# Patient Record
Sex: Female | Born: 1965 | Race: Black or African American | Hispanic: No | Marital: Married | Smoking: Never smoker
Health system: Southern US, Community
[De-identification: ages and names within clinical notes are randomized; demographics above are authoritative.]

## PROBLEM LIST (undated history)

## (undated) DIAGNOSIS — R1033 Periumbilical pain: Secondary | ICD-10-CM

## (undated) DIAGNOSIS — I2699 Other pulmonary embolism without acute cor pulmonale: Secondary | ICD-10-CM

## (undated) DIAGNOSIS — D649 Anemia, unspecified: Secondary | ICD-10-CM

## (undated) DIAGNOSIS — K5900 Constipation, unspecified: Secondary | ICD-10-CM

## (undated) DIAGNOSIS — D689 Coagulation defect, unspecified: Secondary | ICD-10-CM

## (undated) HISTORY — DX: Periumbilical pain: R10.33

## (undated) HISTORY — DX: Anemia, unspecified: D64.9

## (undated) HISTORY — DX: Coagulation defect, unspecified: D68.9

## (undated) HISTORY — DX: Constipation, unspecified: K59.00

---

## 1997-07-09 ENCOUNTER — Other Ambulatory Visit: Admission: RE | Admit: 1997-07-09 | Discharge: 1997-07-09 | Payer: Self-pay | Admitting: Obstetrics and Gynecology

## 1997-07-11 ENCOUNTER — Other Ambulatory Visit: Admission: RE | Admit: 1997-07-11 | Discharge: 1997-07-11 | Payer: Self-pay | Admitting: Obstetrics and Gynecology

## 1997-11-21 ENCOUNTER — Emergency Department (HOSPITAL_COMMUNITY): Admission: EM | Admit: 1997-11-21 | Discharge: 1997-11-21 | Payer: Self-pay | Admitting: Emergency Medicine

## 1998-02-09 ENCOUNTER — Ambulatory Visit (HOSPITAL_COMMUNITY): Admission: RE | Admit: 1998-02-09 | Discharge: 1998-02-09 | Payer: Self-pay | Admitting: Obstetrics and Gynecology

## 1998-02-09 ENCOUNTER — Encounter: Payer: Self-pay | Admitting: Obstetrics and Gynecology

## 1998-07-23 ENCOUNTER — Ambulatory Visit (HOSPITAL_COMMUNITY): Admission: RE | Admit: 1998-07-23 | Discharge: 1998-07-23 | Payer: Self-pay | Admitting: General Surgery

## 1998-07-23 ENCOUNTER — Encounter (HOSPITAL_BASED_OUTPATIENT_CLINIC_OR_DEPARTMENT_OTHER): Payer: Self-pay | Admitting: General Surgery

## 1998-10-27 ENCOUNTER — Other Ambulatory Visit: Admission: RE | Admit: 1998-10-27 | Discharge: 1998-10-27 | Payer: Self-pay | Admitting: Obstetrics and Gynecology

## 2000-06-19 ENCOUNTER — Other Ambulatory Visit: Admission: RE | Admit: 2000-06-19 | Discharge: 2000-06-19 | Payer: Self-pay | Admitting: Obstetrics and Gynecology

## 2000-06-27 ENCOUNTER — Encounter: Payer: Self-pay | Admitting: Obstetrics and Gynecology

## 2000-06-27 ENCOUNTER — Ambulatory Visit (HOSPITAL_COMMUNITY): Admission: RE | Admit: 2000-06-27 | Discharge: 2000-06-27 | Payer: Self-pay | Admitting: Obstetrics and Gynecology

## 2002-03-13 ENCOUNTER — Other Ambulatory Visit: Admission: RE | Admit: 2002-03-13 | Discharge: 2002-03-13 | Payer: Self-pay | Admitting: Obstetrics and Gynecology

## 2003-09-08 ENCOUNTER — Emergency Department (HOSPITAL_COMMUNITY): Admission: EM | Admit: 2003-09-08 | Discharge: 2003-09-08 | Payer: Self-pay | Admitting: Family Medicine

## 2003-12-18 ENCOUNTER — Emergency Department (HOSPITAL_COMMUNITY): Admission: EM | Admit: 2003-12-18 | Discharge: 2003-12-18 | Payer: Self-pay | Admitting: Emergency Medicine

## 2004-08-09 ENCOUNTER — Emergency Department (HOSPITAL_COMMUNITY): Admission: EM | Admit: 2004-08-09 | Discharge: 2004-08-09 | Payer: Self-pay | Admitting: Internal Medicine

## 2004-11-11 ENCOUNTER — Emergency Department (HOSPITAL_COMMUNITY): Admission: EM | Admit: 2004-11-11 | Discharge: 2004-11-11 | Payer: Self-pay | Admitting: Family Medicine

## 2005-08-22 ENCOUNTER — Ambulatory Visit (HOSPITAL_COMMUNITY): Admission: RE | Admit: 2005-08-22 | Discharge: 2005-08-22 | Payer: Self-pay | Admitting: Internal Medicine

## 2005-08-24 ENCOUNTER — Encounter: Admission: RE | Admit: 2005-08-24 | Discharge: 2005-08-24 | Payer: Self-pay | Admitting: Internal Medicine

## 2006-07-29 ENCOUNTER — Inpatient Hospital Stay (HOSPITAL_COMMUNITY): Admission: EM | Admit: 2006-07-29 | Discharge: 2006-08-03 | Payer: Self-pay | Admitting: Emergency Medicine

## 2006-07-31 ENCOUNTER — Ambulatory Visit: Payer: Self-pay | Admitting: *Deleted

## 2006-07-31 ENCOUNTER — Encounter (INDEPENDENT_AMBULATORY_CARE_PROVIDER_SITE_OTHER): Payer: Self-pay | Admitting: Internal Medicine

## 2006-11-20 ENCOUNTER — Encounter: Admission: RE | Admit: 2006-11-20 | Discharge: 2006-11-20 | Payer: Self-pay | Admitting: Obstetrics and Gynecology

## 2007-03-01 DIAGNOSIS — I2699 Other pulmonary embolism without acute cor pulmonale: Secondary | ICD-10-CM

## 2007-03-01 HISTORY — DX: Other pulmonary embolism without acute cor pulmonale: I26.99

## 2008-02-04 ENCOUNTER — Emergency Department (HOSPITAL_COMMUNITY): Admission: EM | Admit: 2008-02-04 | Discharge: 2008-02-04 | Payer: Self-pay | Admitting: Family Medicine

## 2008-06-30 ENCOUNTER — Emergency Department (HOSPITAL_COMMUNITY): Admission: EM | Admit: 2008-06-30 | Discharge: 2008-06-30 | Payer: Self-pay | Admitting: Family Medicine

## 2008-10-13 ENCOUNTER — Emergency Department (HOSPITAL_COMMUNITY): Admission: EM | Admit: 2008-10-13 | Discharge: 2008-10-13 | Payer: Self-pay | Admitting: Family Medicine

## 2009-01-12 ENCOUNTER — Encounter: Admission: RE | Admit: 2009-01-12 | Discharge: 2009-01-12 | Payer: Self-pay | Admitting: Internal Medicine

## 2009-01-19 ENCOUNTER — Ambulatory Visit (HOSPITAL_COMMUNITY): Admission: RE | Admit: 2009-01-19 | Discharge: 2009-01-19 | Payer: Self-pay | Admitting: Obstetrics and Gynecology

## 2009-01-28 HISTORY — PX: ABDOMINAL HYSTERECTOMY: SHX81

## 2009-01-30 ENCOUNTER — Ambulatory Visit: Payer: Self-pay | Admitting: Oncology

## 2009-02-11 LAB — CMP (CANCER CENTER ONLY)
Alkaline Phosphatase: 68 U/L (ref 26–84)
Glucose, Bld: 105 mg/dL (ref 73–118)
Sodium: 140 mEq/L (ref 128–145)
Total Bilirubin: 0.4 mg/dl (ref 0.20–1.60)
Total Protein: 7 g/dL (ref 6.4–8.1)

## 2009-02-11 LAB — CBC WITH DIFFERENTIAL (CANCER CENTER ONLY)
BASO%: 0.4 % (ref 0.0–2.0)
Eosinophils Absolute: 0.1 10*3/uL (ref 0.0–0.5)
MONO#: 0.3 10*3/uL (ref 0.1–0.9)
MONO%: 6.2 % (ref 0.0–13.0)
NEUT#: 2.6 10*3/uL (ref 1.5–6.5)
Platelets: 206 10*3/uL (ref 145–400)
RBC: 4.89 10*6/uL (ref 3.70–5.32)
WBC: 5 10*3/uL (ref 3.9–10.0)

## 2009-02-16 LAB — RETICULOCYTES (CHCC): Retic Ct Pct: 1.6 % (ref 0.4–3.1)

## 2009-02-16 LAB — IRON AND TIBC
%SAT: 7 % — ABNORMAL LOW (ref 20–55)
Iron: 28 ug/dL — ABNORMAL LOW (ref 42–145)
UIBC: 364 ug/dL

## 2009-02-16 LAB — VITAMIN B12: Vitamin B-12: 387 pg/mL (ref 211–911)

## 2009-02-16 LAB — FOLATE: Folate: 15.1 ng/mL

## 2009-02-16 LAB — HYPERCOAGULABLE PANEL, COMPREHENSIVE RET.
AntiThromb III Func: 101 % (ref 76–126)
Beta-2 Glyco I IgG: <3 U/mL (ref <=15)
Beta-2-Glycoprotein I IgA: <3 U/mL (ref <=15)
Beta-2-Glycoprotein I IgM: <3 U/mL (ref <=15)
Homocysteine: 10.4 umol/L (ref 4.0–15.4)
Protein S Activity: 102 % (ref 69–129)
Protein S Ag, Total: 116 % (ref 70–140)

## 2009-02-16 LAB — HEMOGLOBINOPATHY EVALUATION
Hemoglobin Other: 0 % (ref 0.0–0.0)
Hgb A: 97.9 % — ABNORMAL HIGH (ref 96.8–97.8)

## 2009-02-16 LAB — ERYTHROPOIETIN: Erythropoietin: 33.4 m[IU]/mL (ref 2.6–34.0)

## 2009-02-28 HISTORY — PX: ABDOMINAL SURGERY: SHX537

## 2009-03-03 ENCOUNTER — Ambulatory Visit (HOSPITAL_COMMUNITY): Admission: RE | Admit: 2009-03-03 | Discharge: 2009-03-04 | Payer: Self-pay | Admitting: Obstetrics and Gynecology

## 2009-03-03 ENCOUNTER — Encounter (INDEPENDENT_AMBULATORY_CARE_PROVIDER_SITE_OTHER): Payer: Self-pay | Admitting: Obstetrics and Gynecology

## 2009-03-09 ENCOUNTER — Ambulatory Visit: Payer: Self-pay | Admitting: Oncology

## 2009-03-09 ENCOUNTER — Inpatient Hospital Stay (HOSPITAL_COMMUNITY): Admission: AD | Admit: 2009-03-09 | Discharge: 2009-03-15 | Payer: Self-pay | Admitting: Obstetrics and Gynecology

## 2009-03-10 ENCOUNTER — Ambulatory Visit: Payer: Self-pay | Admitting: Internal Medicine

## 2009-03-10 ENCOUNTER — Ambulatory Visit: Payer: Self-pay | Admitting: Surgery

## 2009-03-12 ENCOUNTER — Encounter (INDEPENDENT_AMBULATORY_CARE_PROVIDER_SITE_OTHER): Payer: Self-pay | Admitting: Obstetrics and Gynecology

## 2009-03-13 ENCOUNTER — Encounter: Payer: Self-pay | Admitting: Internal Medicine

## 2009-03-13 ENCOUNTER — Ambulatory Visit: Payer: Self-pay | Admitting: Oncology

## 2009-03-18 LAB — CBC WITH DIFFERENTIAL (CANCER CENTER ONLY)
BASO#: 0 10*3/uL (ref 0.0–0.2)
MCH: 25.1 pg — ABNORMAL LOW (ref 26.0–34.0)
MCV: 78 fL — ABNORMAL LOW (ref 81–101)
MONO#: 0.4 10*3/uL (ref 0.1–0.9)
MONO%: 5.9 % (ref 0.0–13.0)
NEUT#: 4.1 10*3/uL (ref 1.5–6.5)
RBC: 4.17 10*6/uL (ref 3.70–5.32)
RDW: 18.6 % — ABNORMAL HIGH (ref 10.5–14.6)
WBC: 6 10*3/uL (ref 3.9–10.0)

## 2009-03-23 ENCOUNTER — Ambulatory Visit (HOSPITAL_COMMUNITY): Admission: RE | Admit: 2009-03-23 | Discharge: 2009-03-23 | Payer: Self-pay | Admitting: Oncology

## 2009-03-26 LAB — CBC WITH DIFFERENTIAL (CANCER CENTER ONLY)
BASO#: 0 10*3/uL (ref 0.0–0.2)
BASO%: 0.5 % (ref 0.0–2.0)
EOS%: 2.1 % (ref 0.0–7.0)
Eosinophils Absolute: 0.1 10*3/uL (ref 0.0–0.5)
HCT: 33.4 % — ABNORMAL LOW (ref 34.8–46.6)
HGB: 10.9 g/dL — ABNORMAL LOW (ref 11.6–15.9)
LYMPH#: 1.3 10*3/uL (ref 0.9–3.3)
MCV: 76 fL — ABNORMAL LOW (ref 81–101)
MONO#: 0.3 10*3/uL (ref 0.1–0.9)
NEUT#: 2.8 10*3/uL (ref 1.5–6.5)
Platelets: 375 10*3/uL (ref 145–400)
WBC: 4.6 10*3/uL (ref 3.9–10.0)

## 2009-03-26 LAB — IRON AND TIBC
%SAT: 14 % — ABNORMAL LOW (ref 20–55)
TIBC: 309 ug/dL (ref 250–470)
UIBC: 265 ug/dL

## 2009-05-08 ENCOUNTER — Ambulatory Visit: Payer: Self-pay | Admitting: Oncology

## 2010-01-13 ENCOUNTER — Encounter: Admission: RE | Admit: 2010-01-13 | Discharge: 2010-01-13 | Payer: Self-pay | Admitting: Internal Medicine

## 2010-02-12 ENCOUNTER — Ambulatory Visit: Payer: Self-pay | Admitting: Oncology

## 2010-03-21 ENCOUNTER — Encounter: Payer: Self-pay | Admitting: Internal Medicine

## 2010-05-16 LAB — CROSSMATCH
ABO/RH(D): O POS
Antibody Screen: NEGATIVE

## 2010-05-16 LAB — PROTIME-INR
INR: 1.11 (ref 0.00–1.49)
INR: 1.18 (ref 0.00–1.49)
INR: 1.06 (ref 0.00–1.49)
Prothrombin Time: 14.2 s (ref 11.6–15.2)

## 2010-05-16 LAB — COMPREHENSIVE METABOLIC PANEL
ALT: 67 U/L — ABNORMAL HIGH (ref 0–35)
ALT: 70 U/L — ABNORMAL HIGH (ref 0–35)
AST: 62 U/L — ABNORMAL HIGH (ref 0–37)
Albumin: 2.4 g/dL — ABNORMAL LOW (ref 3.5–5.2)
Albumin: 2.7 g/dL — ABNORMAL LOW (ref 3.5–5.2)
Alkaline Phosphatase: 76 U/L (ref 39–117)
BUN: 11 mg/dL (ref 6–23)
CO2: 24 mEq/L (ref 19–32)
Calcium: 7.7 mg/dL — ABNORMAL LOW (ref 8.4–10.5)
Calcium: 8.4 mg/dL (ref 8.4–10.5)
Chloride: 102 mEq/L (ref 96–112)
Creatinine, Ser: 1.01 mg/dL (ref 0.4–1.2)
GFR calc Af Amer: >60 mL/min (ref >60)
GFR calc Af Amer: >60 mL/min (ref >60)
GFR calc non Af Amer: 60 mL/min — ABNORMAL LOW (ref >60)
GFR calc non Af Amer: >60 mL/min (ref >60)
Glucose, Bld: 146 mg/dL — ABNORMAL HIGH (ref 70–99)
Potassium: 5.4 mEq/L — ABNORMAL HIGH (ref 3.5–5.1)
Potassium: 4.3 mEq/L (ref 3.5–5.1)
Potassium: 6.4 mEq/L (ref 3.5–5.1)
Sodium: 135 mEq/L (ref 135–145)
Sodium: 135 mEq/L (ref 135–145)
Total Protein: 5.2 g/dL — ABNORMAL LOW (ref 6.0–8.3)
Total Protein: 6.1 g/dL (ref 6.0–8.3)

## 2010-05-16 LAB — URINALYSIS, ROUTINE W REFLEX MICROSCOPIC
Bilirubin Urine: NEGATIVE
Nitrite: NEGATIVE
Specific Gravity, Urine: 1.025 (ref 1.005–1.030)
Urobilinogen, UA: 0.2 mg/dL (ref 0.0–1.0)
pH: 6 (ref 5.0–8.0)

## 2010-05-16 LAB — CBC
HCT: 20 % — ABNORMAL LOW (ref 36.0–46.0)
HCT: 21.5 % — ABNORMAL LOW (ref 36.0–46.0)
HCT: 24 % — ABNORMAL LOW (ref 36.0–46.0)
HCT: 24.1 % — ABNORMAL LOW (ref 36.0–46.0)
HCT: 25.9 % — ABNORMAL LOW (ref 36.0–46.0)
HCT: 27.6 % — ABNORMAL LOW (ref 36.0–46.0)
HCT: 29.2 % — ABNORMAL LOW (ref 36.0–46.0)
HCT: 22.9 % — ABNORMAL LOW (ref 36.0–46.0)
HCT: 26 % — ABNORMAL LOW (ref 36.0–46.0)
Hemoglobin: 6.8 g/dL — CL (ref 12.0–15.0)
Hemoglobin: 7.2 g/dL — ABNORMAL LOW (ref 12.0–15.0)
Hemoglobin: 8 g/dL — ABNORMAL LOW (ref 12.0–15.0)
Hemoglobin: 8.2 g/dL — ABNORMAL LOW (ref 12.0–15.0)
Hemoglobin: 8.7 g/dL — ABNORMAL LOW (ref 12.0–15.0)
Hemoglobin: 8.8 g/dL — ABNORMAL LOW (ref 12.0–15.0)
Hemoglobin: 9.4 g/dL — ABNORMAL LOW (ref 12.0–15.0)
Hemoglobin: 9.8 g/dL — ABNORMAL LOW (ref 12.0–15.0)
Hemoglobin: 7.1 g/dL — ABNORMAL LOW (ref 12.0–15.0)
Hemoglobin: 9.2 g/dL — ABNORMAL LOW (ref 12.0–15.0)
Hemoglobin: 8.7 g/dL — ABNORMAL LOW (ref 12.0–15.0)
MCHC: 33.2 g/dL (ref 30.0–36.0)
MCHC: 33.3 g/dL (ref 30.0–36.0)
MCHC: 33.4 g/dL (ref 30.0–36.0)
MCHC: 33.5 g/dL (ref 30.0–36.0)
MCHC: 33.6 g/dL (ref 30.0–36.0)
MCHC: 33.7 g/dL (ref 30.0–36.0)
MCHC: 33.8 g/dL (ref 30.0–36.0)
MCHC: 33.9 g/dL (ref 30.0–36.0)
MCHC: 30.9 g/dL (ref 30.0–36.0)
MCHC: 31.3 g/dL (ref 30.0–36.0)
MCHC: 33 g/dL (ref 30.0–36.0)
MCHC: 33.7 g/dL (ref 30.0–36.0)
MCV: 75.8 fL — ABNORMAL LOW (ref 78.0–100.0)
MCV: 75.8 fL — ABNORMAL LOW (ref 78.0–100.0)
MCV: 76.4 fL — ABNORMAL LOW (ref 78.0–100.0)
MCV: 78.9 fL (ref 78.0–100.0)
MCV: 79.7 fL (ref 78.0–100.0)
MCV: 79.9 fL (ref 78.0–100.0)
MCV: 79.9 fL (ref 78.0–100.0)
MCV: 65.5 fL — ABNORMAL LOW (ref 78.0–100.0)
MCV: 79.7 fL (ref 78.0–100.0)
Platelets: 178 10*3/uL (ref 150–400)
Platelets: 180 10*3/uL (ref 150–400)
Platelets: 191 10*3/uL (ref 150–400)
Platelets: 200 10*3/uL (ref 150–400)
Platelets: 214 10*3/uL (ref 150–400)
Platelets: 218 10*3/uL (ref 150–400)
Platelets: 311 10*3/uL (ref 150–400)
RBC: 2.63 MIL/uL — ABNORMAL LOW (ref 3.87–5.11)
RBC: 2.84 MIL/uL — ABNORMAL LOW (ref 3.87–5.11)
RBC: 3.04 MIL/uL — ABNORMAL LOW (ref 3.87–5.11)
RBC: 3.05 MIL/uL — ABNORMAL LOW (ref 3.87–5.11)
RBC: 3.23 MIL/uL — ABNORMAL LOW (ref 3.87–5.11)
RBC: 3.32 MIL/uL — ABNORMAL LOW (ref 3.87–5.11)
RBC: 3.46 MIL/uL — ABNORMAL LOW (ref 3.87–5.11)
RBC: 3.65 MIL/uL — ABNORMAL LOW (ref 3.87–5.11)
RBC: 3.57 MIL/uL — ABNORMAL LOW (ref 3.87–5.11)
RBC: 3.26 MIL/uL — ABNORMAL LOW (ref 3.87–5.11)
RDW: 20 % — ABNORMAL HIGH (ref 11.5–15.5)
RDW: 20.5 % — ABNORMAL HIGH (ref 11.5–15.5)
RDW: 21.1 % — ABNORMAL HIGH (ref 11.5–15.5)
RDW: 21.2 % — ABNORMAL HIGH (ref 11.5–15.5)
RDW: 21.2 % — ABNORMAL HIGH (ref 11.5–15.5)
RDW: 22.1 % — ABNORMAL HIGH (ref 11.5–15.5)
RDW: 22.1 % — ABNORMAL HIGH (ref 11.5–15.5)
RDW: 22.1 % — ABNORMAL HIGH (ref 11.5–15.5)
RDW: 23.2 % — ABNORMAL HIGH (ref 11.5–15.5)
RDW: 25.9 % — ABNORMAL HIGH (ref 11.5–15.5)
RDW: 26 % — ABNORMAL HIGH (ref 11.5–15.5)
RDW: 26.5 % — ABNORMAL HIGH (ref 11.5–15.5)
WBC: 10.1 10*3/uL (ref 4.0–10.5)
WBC: 11 10*3/uL — ABNORMAL HIGH (ref 4.0–10.5)
WBC: 11.6 10*3/uL — ABNORMAL HIGH (ref 4.0–10.5)
WBC: 12.8 10*3/uL — ABNORMAL HIGH (ref 4.0–10.5)
WBC: 16 10*3/uL — ABNORMAL HIGH (ref 4.0–10.5)
WBC: 18 10*3/uL — ABNORMAL HIGH (ref 4.0–10.5)
WBC: 6.2 10*3/uL (ref 4.0–10.5)

## 2010-05-16 LAB — GLUCOSE, CAPILLARY
Glucose-Capillary: 114 mg/dL — ABNORMAL HIGH (ref 70–99)
Glucose-Capillary: 121 mg/dL — ABNORMAL HIGH (ref 70–99)
Glucose-Capillary: 142 mg/dL — ABNORMAL HIGH (ref 70–99)
Glucose-Capillary: 151 mg/dL — ABNORMAL HIGH (ref 70–99)
Glucose-Capillary: 153 mg/dL — ABNORMAL HIGH (ref 70–99)
Glucose-Capillary: 96 mg/dL (ref 70–99)
Glucose-Capillary: 93 mg/dL (ref 70–99)

## 2010-05-16 LAB — POCT I-STAT 4, (NA,K, GLUC, HGB,HCT)
HCT: 25 % — ABNORMAL LOW (ref 36.0–46.0)
Hemoglobin: 8.5 g/dL — ABNORMAL LOW (ref 12.0–15.0)
Sodium: 139 mEq/L (ref 135–145)

## 2010-05-16 LAB — DIFFERENTIAL
Basophils Absolute: 0 10*3/uL (ref 0.0–0.1)
Basophils Relative: 0 % (ref 0–1)
Eosinophils Absolute: 0 10*3/uL (ref 0.0–0.7)
Eosinophils Absolute: 0 10*3/uL (ref 0.0–0.7)
Eosinophils Relative: 0 % (ref 0–5)
Lymphocytes Relative: 23 % (ref 12–46)
Lymphs Abs: 0.7 10*3/uL (ref 0.7–4.0)
Lymphs Abs: 1 10*3/uL (ref 0.7–4.0)
Monocytes Absolute: 0.7 10*3/uL (ref 0.1–1.0)
Monocytes Absolute: 0.7 10*3/uL (ref 0.1–1.0)
Monocytes Relative: 4 % (ref 3–12)
Monocytes Relative: 7 % (ref 3–12)
Neutro Abs: 14 10*3/uL — ABNORMAL HIGH (ref 1.7–7.7)
Neutro Abs: 4.4 10*3/uL (ref 1.7–7.7)
Neutrophils Relative %: 69 % (ref 43–77)
Neutrophils Relative %: 90 % — ABNORMAL HIGH (ref 43–77)

## 2010-05-16 LAB — CULTURE, BLOOD (ROUTINE X 2): Culture: NO GROWTH

## 2010-05-16 LAB — BASIC METABOLIC PANEL WITH GFR
BUN: 4 mg/dL — ABNORMAL LOW (ref 6–23)
BUN: 9 mg/dL (ref 6–23)
CO2: 24 meq/L (ref 19–32)
CO2: 25 meq/L (ref 19–32)
Calcium: 7.4 mg/dL — ABNORMAL LOW (ref 8.4–10.5)
Calcium: 7.8 mg/dL — ABNORMAL LOW (ref 8.4–10.5)
Chloride: 100 meq/L (ref 96–112)
Chloride: 109 meq/L (ref 96–112)
Creatinine, Ser: 0.66 mg/dL (ref 0.4–1.2)
Creatinine, Ser: 0.76 mg/dL (ref 0.4–1.2)
GFR calc non Af Amer: >60 mL/min
GFR calc non Af Amer: >60 mL/min
Glucose, Bld: 128 mg/dL — ABNORMAL HIGH (ref 70–99)
Glucose, Bld: 98 mg/dL (ref 70–99)
Potassium: 3.6 meq/L (ref 3.5–5.1)
Potassium: 4.1 meq/L (ref 3.5–5.1)
Sodium: 131 meq/L — ABNORMAL LOW (ref 135–145)
Sodium: 139 meq/L (ref 135–145)

## 2010-05-16 LAB — URINE CULTURE

## 2010-05-16 LAB — BASIC METABOLIC PANEL
CO2: 24 mEq/L (ref 19–32)
GFR calc Af Amer: >60 mL/min (ref >60)
Glucose, Bld: 138 mg/dL — ABNORMAL HIGH (ref 70–99)
Potassium: 4.3 mEq/L (ref 3.5–5.1)

## 2010-05-16 LAB — PREGNANCY, URINE: Preg Test, Ur: NEGATIVE

## 2010-05-16 LAB — HEPARIN LEVEL (UNFRACTIONATED): Heparin Unfractionated: 0.41 IU/mL (ref 0.30–0.70)

## 2010-05-16 LAB — FIBRINOGEN: Fibrinogen: 586 mg/dL — ABNORMAL HIGH (ref 204–475)

## 2010-05-16 LAB — HEMOGLOBIN A1C: Hgb A1c MFr Bld: 5.5 % (ref 4.6–6.1)

## 2010-05-16 LAB — APTT
aPTT: 31 seconds (ref 24–37)
aPTT: 34 seconds (ref 24–37)

## 2010-05-16 LAB — TYPE AND SCREEN: ABO/RH(D): O POS

## 2010-05-16 LAB — HEPARIN ANTI-XA: Heparin LMW: 0.94 IU/mL

## 2010-06-02 LAB — CBC
HCT: 27.9 % — ABNORMAL LOW (ref 36.0–46.0)
Hemoglobin: 8.6 g/dL — ABNORMAL LOW (ref 12.0–15.0)
MCHC: 30.7 g/dL (ref 30.0–36.0)
MCV: 61.5 fL — ABNORMAL LOW (ref 78.0–100.0)
RBC: 4.53 MIL/uL (ref 3.87–5.11)
RDW: 24 % — ABNORMAL HIGH (ref 11.5–15.5)

## 2010-07-03 ENCOUNTER — Emergency Department (HOSPITAL_COMMUNITY)
Admission: EM | Admit: 2010-07-03 | Discharge: 2010-07-03 | Disposition: A | Discharge status: Home / Self Care | Payer: Self-pay | Attending: Emergency Medicine | Admitting: Emergency Medicine

## 2010-07-03 ENCOUNTER — Emergency Department (HOSPITAL_COMMUNITY): Payer: Self-pay

## 2010-07-03 DIAGNOSIS — R109 Unspecified abdominal pain: Secondary | ICD-10-CM | POA: Insufficient documentation

## 2010-07-03 DIAGNOSIS — R5381 Other malaise: Secondary | ICD-10-CM | POA: Insufficient documentation

## 2010-07-03 DIAGNOSIS — Z79899 Other long term (current) drug therapy: Secondary | ICD-10-CM | POA: Insufficient documentation

## 2010-07-03 DIAGNOSIS — R197 Diarrhea, unspecified: Secondary | ICD-10-CM | POA: Insufficient documentation

## 2010-07-03 DIAGNOSIS — Z86718 Personal history of other venous thrombosis and embolism: Secondary | ICD-10-CM | POA: Insufficient documentation

## 2010-07-03 DIAGNOSIS — R110 Nausea: Secondary | ICD-10-CM | POA: Insufficient documentation

## 2010-07-03 DIAGNOSIS — R55 Syncope and collapse: Secondary | ICD-10-CM | POA: Insufficient documentation

## 2010-07-03 DIAGNOSIS — R1915 Other abnormal bowel sounds: Secondary | ICD-10-CM | POA: Insufficient documentation

## 2010-07-03 DIAGNOSIS — R0602 Shortness of breath: Secondary | ICD-10-CM | POA: Insufficient documentation

## 2010-07-03 LAB — BASIC METABOLIC PANEL
CO2: 27 mEq/L (ref 19–32)
Calcium: 9.4 mg/dL (ref 8.4–10.5)
Creatinine, Ser: 0.94 mg/dL (ref 0.4–1.2)
GFR calc Af Amer: >60 mL/min (ref >60)
GFR calc non Af Amer: >60 mL/min (ref >60)
Sodium: 139 mEq/L (ref 135–145)

## 2010-07-03 LAB — URINALYSIS, ROUTINE W REFLEX MICROSCOPIC
Glucose, UA: NEGATIVE mg/dL
Leukocytes, UA: NEGATIVE
Protein, ur: NEGATIVE mg/dL
Specific Gravity, Urine: 1.017 (ref 1.005–1.030)
pH: 5.5 (ref 5.0–8.0)

## 2010-07-03 LAB — CBC
MCH: 22.2 pg — ABNORMAL LOW (ref 26.0–34.0)
MCHC: 31.6 g/dL (ref 30.0–36.0)
RDW: 14.9 % (ref 11.5–15.5)

## 2010-07-03 LAB — DIFFERENTIAL
Basophils Absolute: 0 10*3/uL (ref 0.0–0.1)
Basophils Relative: 1 % (ref 0–1)
Eosinophils Absolute: 0.1 10*3/uL (ref 0.0–0.7)
Eosinophils Relative: 1 % (ref 0–5)
Monocytes Absolute: 0.4 10*3/uL (ref 0.1–1.0)
Monocytes Relative: 5 % (ref 3–12)

## 2010-07-03 LAB — URINE MICROSCOPIC-ADD ON

## 2010-07-03 MED ORDER — IOHEXOL 300 MG/ML  SOLN
100.0000 mL | Freq: Once | INTRAMUSCULAR | Status: AC | PRN
  Administered 2010-07-03: 100 mL via INTRAVENOUS

## 2010-07-13 NOTE — H&P (Signed)
NAMETEENA, MANGUS          ACCOUNT NO.:  [id number]   MEDICAL RECORD NO.:  [id number]          PATIENT TYPE:  EMS   LOCATION:  ED                           FACILITY:  [facility]   PHYSICIAN:  Lonia Blood, M.D.       DATE OF BIRTH:  12-15-1965   DATE OF ADMISSION:  07/28/2006  DATE OF DISCHARGE:                              HISTORY & PHYSICAL   CHIEF COMPLAINT:  Chest pain.   HISTORY OF PRESENT ILLNESS:  Ms. Seybold is a 45 year old woman with  a past medical history of anemia who presents [facility] emergency room  with a two week history of worsening shortness of breath and chest  discomfort. She also reports pain in the left side of the neck and head.  The symptoms were severe enough tonight that the patient came to the  emergency room.   PAST MEDICAL HISTORY:  Fibroids.   FAMILY HISTORY:  The patient's mother had breast cancer. She is alive.   SOCIAL HISTORY:  The patient is married. Does smoke cigarettes. Does not  drink alcohol. She has four children. She works as a Automotive engineer.   DRUG ALLERGIES:  No known drug allergies.   MEDICATIONS:  Ibuprofen.   REVIEW OF SYSTEMS:  Positive for headaches. Positive for left neck pain.  Positive for constipation. Positive hemorrhoids. Positive for heavy  menses.   PHYSICAL EXAMINATION:  VITAL SIGNS:  Temperature 98.2, pulse 69,  respirations 20, blood pressure 114/72.  Saturation 98% on room air.  GENERAL:  The patient is alert and oriented to person, place and time.  Well-developed, well-nourished African-American woman in no acute  distress.  HEAD: Normocephalic, atraumatic.  NECK: Without JVD. No carotid bruits. No palpable lymphadenopathy.  CHEST:  Clear to auscultation without wheezes, rales or crackles.  HEART: Regular rate and rhythm with no murmurs, rubs or gallops.  ABDOMEN: The patient's abdomen is soft, nontender and nondistended.  Bowel sounds are present.  EXTREMITIES:  Lower extremities have no edema.  SKIN:  Warm and dry without any suspicious rashes.   LABORATORY DATA:  D. dimer is 1.03.  Sodium 133, potassium 3.4, chloride  98, bicarb 25, BUN 8, creatinine 0.8, glucose 116.  White blood cell  count 6.5, hemoglobin 8.9, platelet count 180.  Chest CT shows right  lower lobe pulmonary embolus.   IMPRESSION:  1. Pulmonary embolus - I am not 100% sure that the CT scan of the      chest is accurate.  The patient does not have tachycardia and does      not have hypoxia.  She may have a minor pulmonary embolism but I      would like to confirm that with a ventilation/perfusion scan. I      would also like to proceed with lower extremity venous Dopplers to      rule out the presence of significant DVT.  The patient will be      started on subcutaneous Lovenox until this is sorted out.  If      indeed Ms. Laffey has a pulmonary embolus we will definitely  need to pursue a hypercoagulable workup since this is an unprovoked      event.  2. Anemia with microcytosis.  Apparently the patient's father has beta      thalassemia. The patient probably has iron deficiency secondary to      her hemorrhoidal bleeding as well as her fibroid reltaed uterine      bleeding.  3. Hypokalemia.  The patient will receive potassium and her BMET will      be monitored.      Lonia Blood, M.D.  Electronically Signed     SL/MEDQ  D:  07/29/2006  T:  07/29/2006  Job:  [id number]   cc:   Merlene Laughter. Renae Gloss, M.D.  Fax: [phone]

## 2010-07-13 NOTE — Discharge Summary (Signed)
Kristy [name], Kristy [name]          ACCOUNT NO.:  [id number]   MEDICAL RECORD NO.:  [id number]          PATIENT TYPE:  INP   LOCATION:  1415                         FACILITY:  [facility]   PHYSICIAN:  Hind I Elsaid, MD      DATE OF BIRTH:  March 01, 1965   DATE OF ADMISSION:  07/28/2006  DATE OF DISCHARGE:                               DISCHARGE SUMMARY   PRIMARY CARE PHYSICIAN:  Merlene Laughter. Renae Gloss, M.D.   DISCHARGE DIAGNOSES:  1. Acute right lower lobe segmental pulmonary embolism.  2. Anemia, macrocytic, secondary to menorrhagia.  3. Asymptomatic bradycardia.   Medications to be dictated on the day of discharge.   PROCEDURE:  CT scan of the chest showed right lower lobe  segmental/subsegmental pulmonary embolism.  No evidence of right heart  strain.  A small hiatal hernia.   Chest x-ray, no acute cardiopulmonary disease.  Mild bronchial  thickening, probably due to chronic bronchitis or smoking.   Echo, left ventricular ejection fraction 60%.  No left ventricular  regional wall motion abnormalities.  Left ventricular diastolic function  was normal.  . There is density on the anterior mitral valve, which may  be calcium but cannot rule out vegetation.   BRIEF HISTORY:  58. A 45 year old female with past medical history of anemia presents      to [facility] with worsening shortness of breath and      chest discomfort.  CT scan showed pulmonary embolism.  D-dimer was      also high, although the patient has not had hypoxia or tachycardia.      We started the patient on Lovenox and Coumadin.  A venous Doppler      of the lower extremity was negative.  A 2D echo was done to rule      out any heart strain with the results as above.  2. Anemia with microcytic anemia.  Patient started on ferrous sulfate      and folic acid.  3. Hypokalemia:  Status post replacement.  4. Bradycardia:  Remains asymptomatic.  Regarding the above 2D echo      report, which was just done today, will  consider transesophageal      echo for evaluation of this density on the anterior mitral valve,      which cannot rule out vegetation.   DISPOSITION:  Will be DC'd home when medically stable.     Hind Bosie Helper, MD  Electronically Signed    HIE/MEDQ  D:  08/01/2006  T:  08/02/2006  Job:  [id number]

## 2010-07-16 NOTE — Discharge Summary (Signed)
[name], Kristy [name]          ACCOUNT NO.:  [id number]   MEDICAL RECORD NO.:  [id number]          PATIENT TYPE:  INP   LOCATION:  1415                         FACILITY:  [facility]   PHYSICIAN:  Lonia Blood, M.D.       DATE OF BIRTH:  [birth year]   DATE OF ADMISSION:  07/28/2006  DATE OF DISCHARGE:  08/03/2006                               DISCHARGE SUMMARY   ADDENDUM   PRIMARY CARE PHYSICIAN:  Dr. Andi Devon.   DISCHARGE DIAGNOSES:  For a list of discharge diagnoses, please refer to  previously dictated discharge summary done by Dr. Ebony Cargo.   DISCHARGE MEDICATIONS:  1. Lovenox 80 mg twice a day until the INR is above 2.  2. Coumadin 10 mg by mouth at 6 p.m.   CONDITION ON DISCHARGE:  [name] was discharged in good  condition.  At the time of discharge, the patient was without any  symptoms.  The patient was instructed to follow up with her primary care  physician on August 04, 2006, at 9:45 a.m. to have a PT/INR drawn.   PROCEDURE:  For complete list of procedures, refer to previously  dictated discharge summary.   CONSULTATIONS:  None.   HOSPITAL COURSE:  [name] was admitted and started on  anticoagulation.  The patient has had a hypercoagulable panel which did  not indicate any genetic predisposition to blood clot formation.  The  patient will be treated with full-dose Coumadin for about 3-6 months at  the discretion of primary care physician and then the anticoagulation  will be stopped and the need for it will be re-evaluated periodically.  [name]'s symptoms improved and her echocardiogram was within  normal limits, we did not feel there was a need to pursue a  transesophageal echocardiogram.  The patient did not display any signs  of bleeding during this hospitalization.      Lonia Blood, M.D.  Electronically Signed     SL/MEDQ  D:  08/06/2006  T:  08/07/2006  Job:  [id number]   cc:   Merlene Laughter. Renae Gloss, M.D.  Fax: [phone]

## 2010-11-25 ENCOUNTER — Ambulatory Visit (INDEPENDENT_AMBULATORY_CARE_PROVIDER_SITE_OTHER): Payer: Commercial Managed Care - PPO | Admitting: Surgery

## 2010-11-25 ENCOUNTER — Encounter (INDEPENDENT_AMBULATORY_CARE_PROVIDER_SITE_OTHER): Payer: Self-pay | Admitting: Surgery

## 2010-11-25 VITALS — BP 102/78 | HR 60 | Temp 96.6°F | Resp 16 | Ht 68.5 in | Wt 182.0 lb

## 2010-11-25 DIAGNOSIS — M6208 Separation of muscle (nontraumatic), other site: Secondary | ICD-10-CM | POA: Insufficient documentation

## 2010-11-25 DIAGNOSIS — M6200 Separation of muscle (nontraumatic), unspecified site: Secondary | ICD-10-CM

## 2010-11-25 DIAGNOSIS — R1084 Generalized abdominal pain: Secondary | ICD-10-CM | POA: Insufficient documentation

## 2010-11-25 NOTE — Patient Instructions (Signed)

## 2010-11-25 NOTE — Progress Notes (Signed)
Subjective:     Patient ID: Kristy [name], female   DOB: February 28, 1966, 45 y.o.   MRN: [id number]  HPI  Patient Care Team: Merlene Laughter. Renae Gloss as PCP - General (Internal Medicine)  This patient is a 45 y.o.female who presents today for surgical evaluation.   She is a healthy female who had an elective hysterectomy last year. She required emergent laparotomy for postoperative bleeding. She recovered from that relatively well. She never noticed any abdominal problems until a few months ago she thought she saw & felt a little bit of bulging above her belly button. It was not definitely noticeable at first. However, she & her husband noticed that there she revealed a bulging on her clothes. No distinct mass. She has some upper domal pulling and discomfort bilaterally. She struggles with chronic constipation.  She noticed that the bulge and comes and goes. It does not seem as severe lately.    It is not seen to be worse with lifting or activity. No fevers chills or sweats. No history of inflammatory bowel disease. She no longer is fully anticoagulated for a prior DVT.  She does note some general upper and mid abdominal pulling and discomfort.  It is bilateral. It does not seem to be worse with eating. She notes discomfort when she strains to defecate. It is not worse with sneezing or cough. It's not particularly related to activity.    Past Medical History  Diagnosis Date  . Anemia   . Clotting disorder   . Acute constipation   . Abdominal pain, periumbilical     Past Surgical History  Procedure Date  . Abdominal surgery january 2011    internal bleeding   . Abdominal hysterectomy December 2010    History   Social History  . Marital Status: Married    Spouse Name: N/A    Number of Children: N/A  . Years of Education: N/A   Occupational History  . Not on file.   Social History Main Topics  . Smoking status: Never Smoker   . Smokeless tobacco: Never Used  . Alcohol Use: No  .  Drug Use: No  . Sexually Active:    Other Topics Concern  . Not on file   Social History Narrative  . No narrative on file    History reviewed. No pertinent family history.  No current outpatient prescriptions on file.  No Known Allergies     Review of Systems  Constitutional: Negative for fever, chills, diaphoresis, appetite change and fatigue.  HENT: Negative for ear pain, sore throat, trouble swallowing, neck pain and ear discharge.   Eyes: Negative for photophobia, discharge and visual disturbance.  Respiratory: Negative for cough, choking, chest tightness and shortness of breath.   Cardiovascular: Negative for chest pain and palpitations.  Gastrointestinal: Positive for constipation. Negative for nausea, vomiting, abdominal pain, diarrhea, blood in stool, anal bleeding and rectal pain.  Genitourinary: Negative for dysuria, frequency and difficulty urinating.  Musculoskeletal: Negative for myalgias and gait problem.  Skin: Negative for color change, pallor and rash.  Neurological: Negative for dizziness, speech difficulty, weakness and numbness.  Hematological: Negative for adenopathy.  Psychiatric/Behavioral: Negative for confusion and agitation. The patient is not nervous/anxious.        Objective:   Physical Exam  Constitutional: She is oriented to person, place, and time. She appears well-developed and well-nourished. No distress.  HENT:  Head: Normocephalic.  Mouth/Throat: Oropharynx is clear and moist. No oropharyngeal exudate.  Eyes: Conjunctivae and EOM  are normal. Pupils are equal, round, and reactive to light. No scleral icterus.  Neck: Normal range of motion. Neck supple. No tracheal deviation present.  Cardiovascular: Normal rate, regular rhythm and intact distal pulses.   Pulmonary/Chest: Effort normal and breath sounds normal. No respiratory distress. She exhibits no tenderness.  Abdominal: Soft. She exhibits no distension and no mass. There is no  tenderness. There is no rebound and no guarding. Hernia confirmed negative in the right inguinal area and confirmed negative in the left inguinal area.       Mild supraumbilical diastasis recti, noticeable on sitting up but resolves mostly upon standing.  Post partum stretched abdomen.  Sensitive at base of umbilicus.  No definite hernias.  Genitourinary: No vaginal discharge found.  Musculoskeletal: Normal range of motion. She exhibits no tenderness.  Lymphadenopathy:    She has no cervical adenopathy.       Right: No inguinal adenopathy present.       Left: No inguinal adenopathy present.  Neurological: She is alert and oriented to person, place, and time. No cranial nerve deficit. She exhibits normal muscle tone. Coordination normal.  Skin: Skin is warm and dry. No rash noted. She is not diaphoretic. No erythema.  Psychiatric: She has a normal mood and affect. Her behavior is normal. Judgment and thought content normal.       Assessment:     Supraumbilical abdominal wall swelling. Probable diastases recti. No definite hernia.  Chronic upper abdominal pain in the setting of chronic constipation. No obstructive symptoms.   Plan:     At this point I laid several options.  I think at the least she needs to be on a more aggressive bowel regimen to have regular bowel movements. I suspect that is at least contributing to her general upper abdominal discomfort.  She may have a subtle hernia. I offered to perform a  diagnostic laparoscopy to see if there's hernia there that may require surgical repair. However I did not push on this toostrongly. She feels reassured that there is no definite hernia. She will monitor and let me know.  She has no history of cancer nor any definite obstructive or other severely concerning symptoms. Therefore, I don't think a CT scan will add much at this point. However, if she gets worse or does not improve it would be reasonable to get that as well.

## 2010-12-03 LAB — POCT URINALYSIS DIP (DEVICE)
Glucose, UA: NEGATIVE mg/dL
Nitrite: NEGATIVE
Protein, ur: 30 mg/dL — AB
Urobilinogen, UA: 1 mg/dL (ref 0.0–1.0)

## 2010-12-03 LAB — DIFFERENTIAL
Eosinophils Relative: 2 % (ref 0–5)
Lymphocytes Relative: 27 % (ref 12–46)
Lymphs Abs: 1.7 10*3/uL (ref 0.7–4.0)
Monocytes Relative: 9 % (ref 3–12)

## 2010-12-03 LAB — POCT I-STAT, CHEM 8
Calcium, Ion: 1.22 mmol/L (ref 1.12–1.32)
Chloride: 104 mEq/L (ref 96–112)
Glucose, Bld: 91 mg/dL (ref 70–99)
HCT: 31 % — ABNORMAL LOW (ref 36.0–46.0)
Hemoglobin: 10.5 g/dL — ABNORMAL LOW (ref 12.0–15.0)
TCO2: 28 mmol/L (ref 0–100)

## 2010-12-03 LAB — CBC
HCT: 29.5 % — ABNORMAL LOW (ref 36.0–46.0)
WBC: 6.2 10*3/uL (ref 4.0–10.5)

## 2010-12-03 LAB — POCT PREGNANCY, URINE: Preg Test, Ur: NEGATIVE

## 2010-12-03 LAB — B-NATRIURETIC PEPTIDE (CONVERTED LAB): Pro B Natriuretic peptide (BNP): <30 pg/mL (ref 0.0–100.0)

## 2010-12-03 LAB — PROTIME-INR
INR: 1 (ref 0.00–1.49)
Prothrombin Time: 13.1 seconds (ref 11.6–15.2)

## 2010-12-03 LAB — APTT: aPTT: 30 seconds (ref 24–37)

## 2010-12-03 LAB — POCT CARDIAC MARKERS

## 2010-12-03 LAB — D-DIMER, QUANTITATIVE: D-Dimer, Quant: 2.63 ug/mL-FEU — ABNORMAL HIGH (ref 0.00–0.48)

## 2010-12-16 LAB — ANTIPHOSPHOLIPID SYNDROME EVAL, BLD
Anticardiolipin IgM: <7 — ABNORMAL LOW (ref <10)
Antiphosphatidylserine IgG: <11 GPS U/mL (ref <11.0)
DRVVT: 35.4 — ABNORMAL LOW (ref 36.1–47.0)
PTT Lupus Anticoagulant: 54 — ABNORMAL HIGH (ref 36.3–48.8)
PTTLA Confirmation: 0 (ref <8.0)

## 2010-12-16 LAB — PROTIME-INR
INR: 1
INR: 1.2
INR: 1.4
INR: 1.9 — ABNORMAL HIGH
Prothrombin Time: 13.6
Prothrombin Time: 15.1
Prothrombin Time: 17.2 — ABNORMAL HIGH
Prothrombin Time: 19.7 — ABNORMAL HIGH
Prothrombin Time: 22.4 — ABNORMAL HIGH

## 2010-12-16 LAB — CBC
Hemoglobin: 9.1 — ABNORMAL LOW
MCHC: 31.9
MCHC: 32.1
MCHC: 32.2
MCV: 62.7 — ABNORMAL LOW
MCV: 62.8 — ABNORMAL LOW
Platelets: 164
RBC: 4.35
RBC: 4.5
RDW: 21.3 — ABNORMAL HIGH
RDW: 21.6 — ABNORMAL HIGH
RDW: 21.7 — ABNORMAL HIGH
WBC: 4.7
WBC: 5.6

## 2010-12-16 LAB — BASIC METABOLIC PANEL
BUN: 10
CO2: 27
Calcium: 8.7
Chloride: 110
Creatinine, Ser: 0.74
Creatinine, Ser: 0.8
GFR calc Af Amer: >60
GFR calc non Af Amer: >60
Sodium: 140

## 2010-12-16 LAB — CARDIAC PANEL(CRET KIN+CKTOT+MB+TROPI)
CK, MB: 0.3
CK, MB: 0.3
CK, MB: 0.4
Relative Index: INVALID
Total CK: 46
Total CK: 47
Total CK: 49
Troponin I: 0.02

## 2010-12-16 LAB — RETICULOCYTES: Retic Count, Absolute: 93.2

## 2010-12-16 LAB — IRON AND TIBC: UIBC: 296

## 2010-12-16 LAB — VITAMIN B12: Vitamin B-12: 409 (ref 211–911)

## 2010-12-16 LAB — SEDIMENTATION RATE: Sed Rate: 20

## 2011-04-07 IMAGING — CT CT ABD-PELV W/ CM
1 of 5 series · 13 of 32 positions shown, 19 images · IV contrast (OMNIPAQUE)
Comparison: Ultrasound 08/24/2005

CLINICAL DATA: Vaginal hysterectomy 1 week ago.  Elevated white
count.  Abdominal pain.

CT ABDOMEN AND PELVIS WITH CONTRAST
TECHNIQUE: Multidetector CT imaging of the abdomen and pelvis was
performed following the standard protocol during bolus
administration of intravenous contrast.
Contrast: 100 ml Omnipaque 300 IV.

[Series 6: routine abdomen/pelvis with · axial · 0.62mm/px · z∈[-361,+29]mm · 13 of 90 slices shown, 19 images]
[im 6/90  soft-tissue]
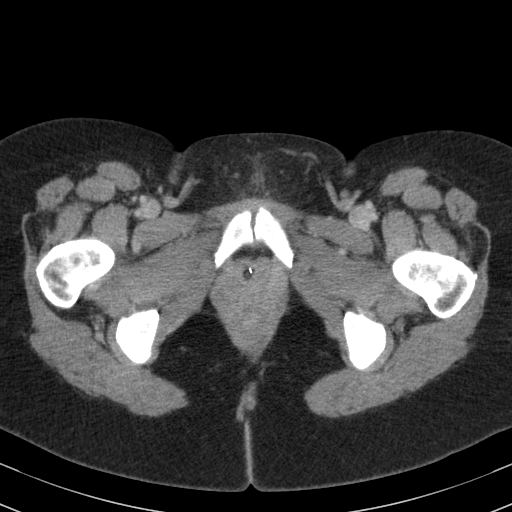
[im 6/90  bone]
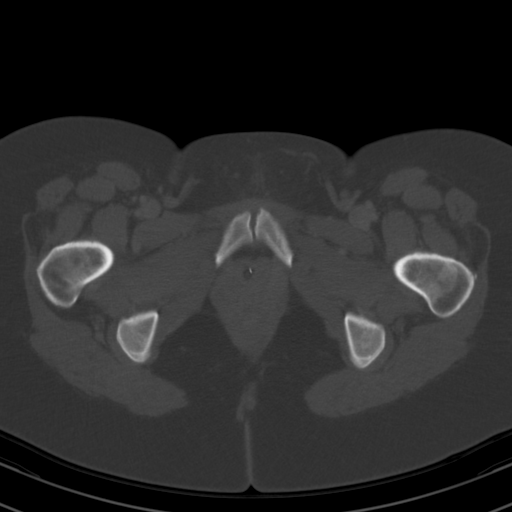
[im 12/90  soft-tissue]
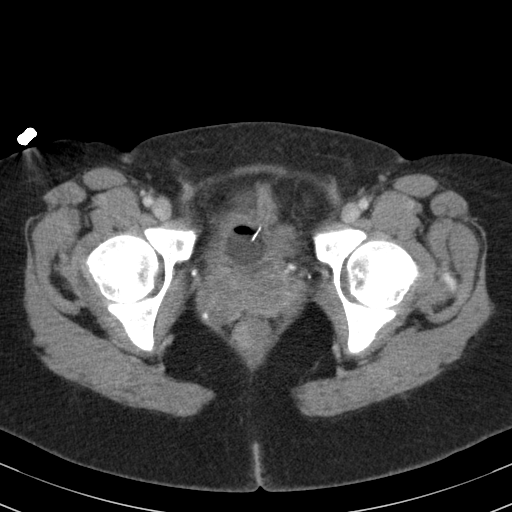
[im 18/90  soft-tissue]
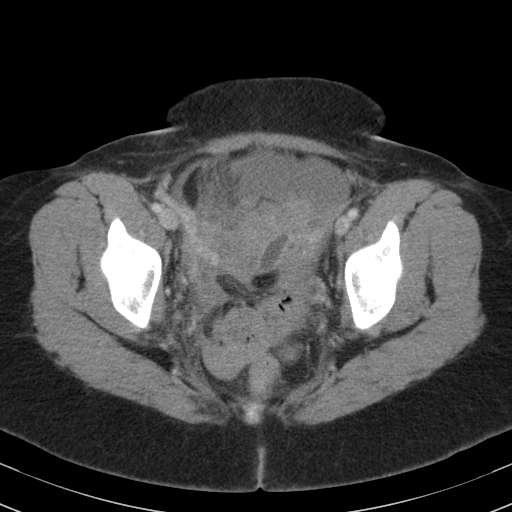
[im 24/90  soft-tissue]
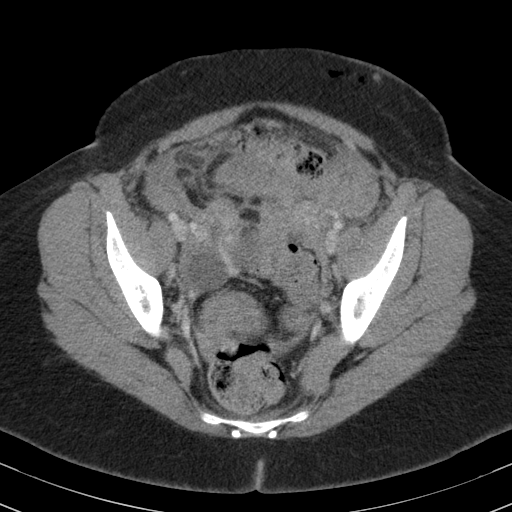
[im 30/90  soft-tissue]
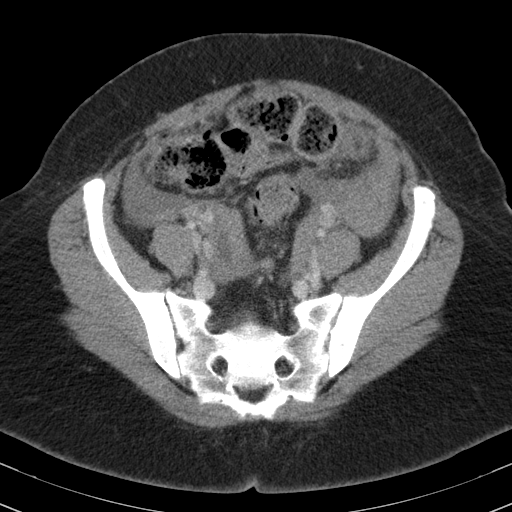
[im 36/90  soft-tissue]
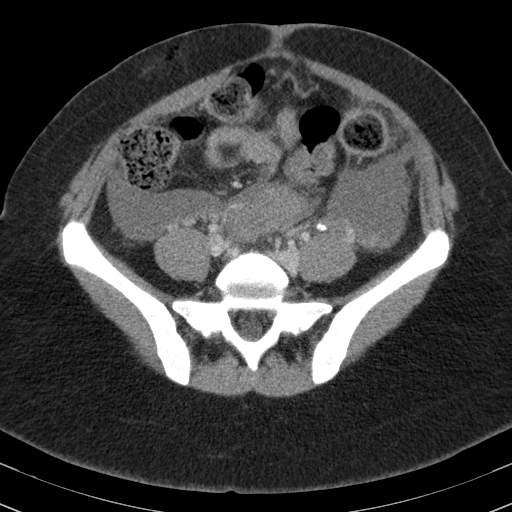
[im 48/90  soft-tissue]
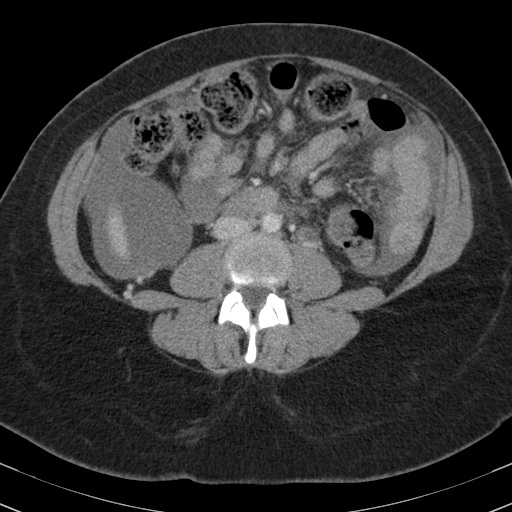
[im 54/90  soft-tissue]
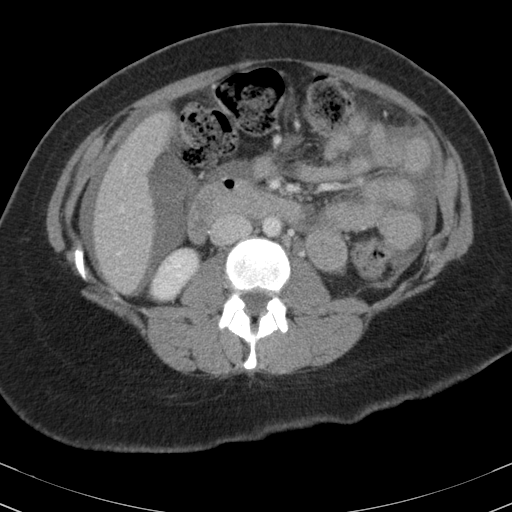
[im 60/90  soft-tissue]
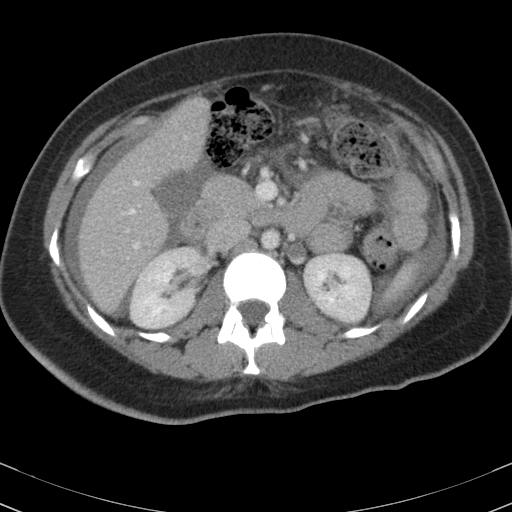
[im 60/90  bone]
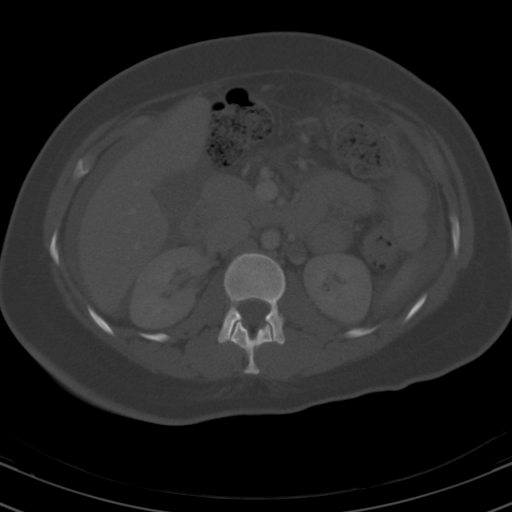
[im 66/90  soft-tissue]
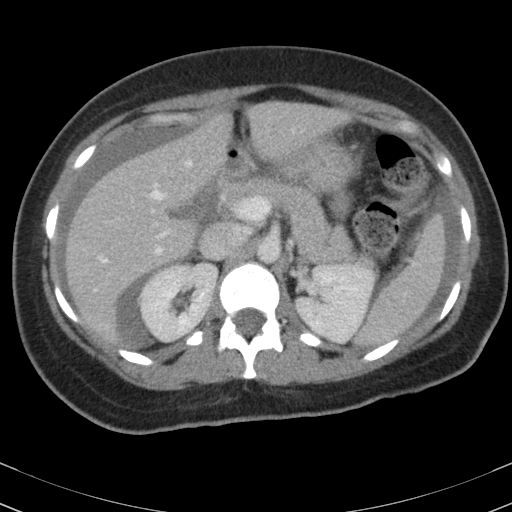
[im 66/90  lung]
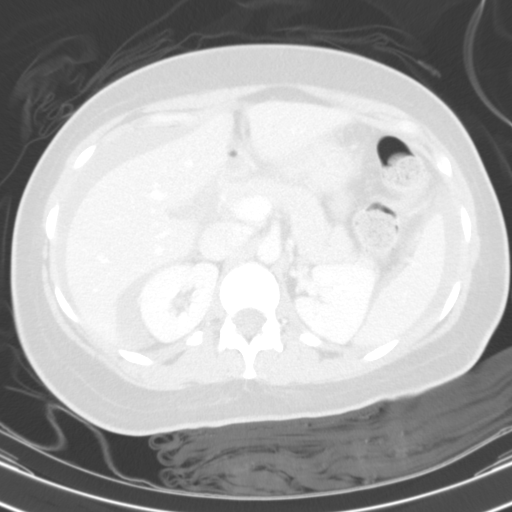
[im 72/90  soft-tissue]
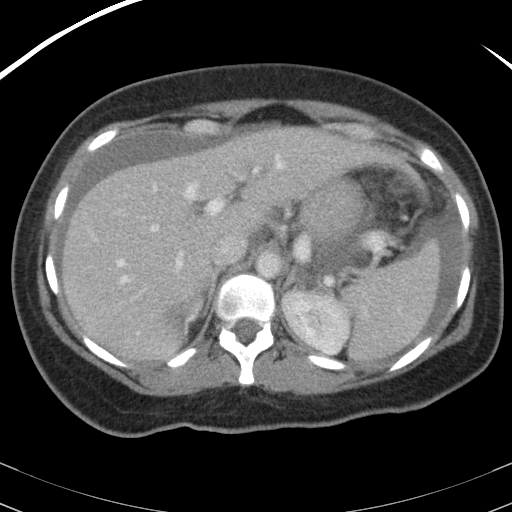
[im 72/90  lung]
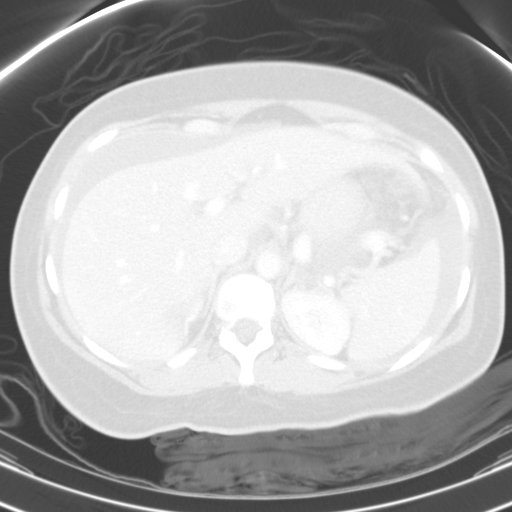
[im 78/90  soft-tissue]
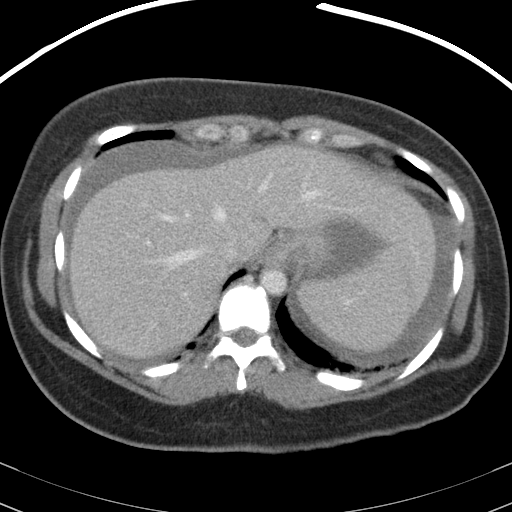
[im 78/90  lung]
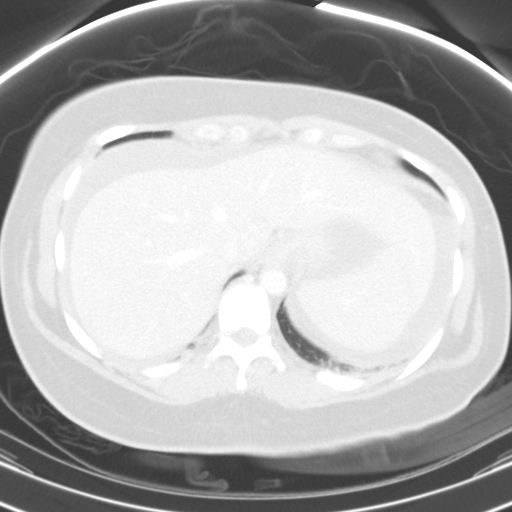
[im 84/90  soft-tissue]
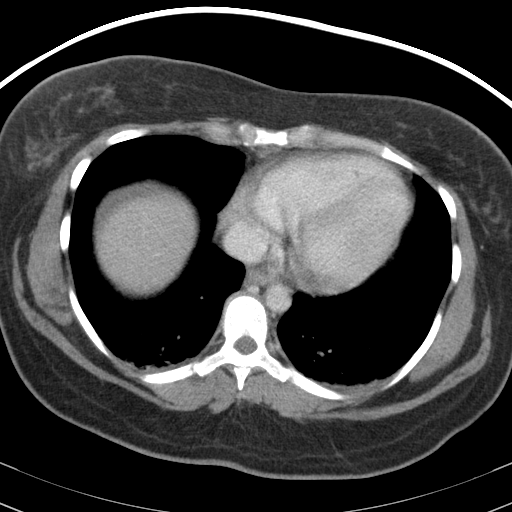
[im 84/90  lung]
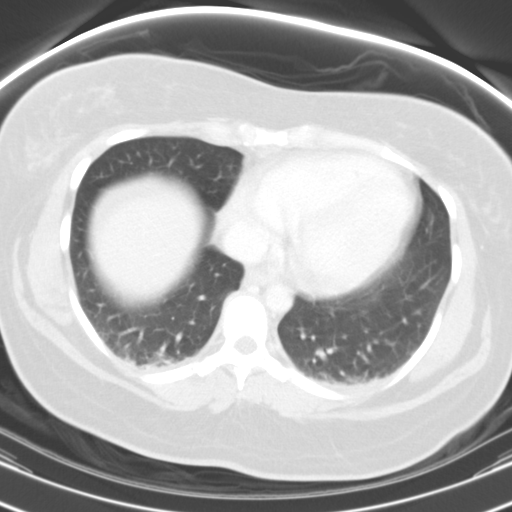

[13 of 32 positions shown; findings below may reference images not displayed]

FINDINGS: There is moderate mixed density ascites in the abdomen.
Fluid in the upper abdomen is low density, then gradually becomes
higher density into the pelvis. Liver, spleen, pancreas, adrenals,
kidneys, gallbladder unremarkable.  Large and small bowel grossly
unremarkable in the upper abdomen.  No free air or adenopathy

The left gonadal vein is distended with central low density
compatible with acute thrombosis.  This continues into the pelvis.

Within the right side of the pelvis, there is a prominent area of
high-density contrast.  This could be extravascular, representing
an area of hemorrhage or pseudoaneurysm.  The the free fluid in the
pelvis is high density, measuring 50 HU.  Centrally in the pelvis,
complex mixed density ill-defined mass is present.  I am concerned
this could represent a large hematoma with blood of different ages.
Large and small bowel grossly unremarkable.
IMPRESSION: I am concerned there can be a possible bleeding vessel or
pseudoaneurysm within the right side of the pelvis.  Mixed density
moderate free fluid throughout the abdomen and pelvis.  Fluid in
the cul-de-sac and pelvis is higher density, compatible with blood.

Left ovarian vein thrombosis.

Critical test results telephoned to Dr. Erxleben at the time of
interpretation on 03/10/2009 at 5855. Dr. Erxleben was not
immediately available.  I left a message with the nurse
practitioner on call to return my call.

## 2011-11-09 ENCOUNTER — Emergency Department (HOSPITAL_COMMUNITY): Payer: BC Managed Care – PPO

## 2011-11-09 ENCOUNTER — Encounter (HOSPITAL_COMMUNITY): Payer: Self-pay | Admitting: Emergency Medicine

## 2011-11-09 ENCOUNTER — Emergency Department (HOSPITAL_COMMUNITY)
Admission: EM | Admit: 2011-11-09 | Discharge: 2011-11-09 | Disposition: A | Discharge status: Home / Self Care | Payer: BC Managed Care – PPO | Attending: Emergency Medicine | Admitting: Emergency Medicine

## 2011-11-09 DIAGNOSIS — R079 Chest pain, unspecified: Secondary | ICD-10-CM | POA: Insufficient documentation

## 2011-11-09 DIAGNOSIS — R0602 Shortness of breath: Secondary | ICD-10-CM | POA: Insufficient documentation

## 2011-11-09 LAB — POCT I-STAT, CHEM 8
Calcium, Ion: 1.18 mmol/L (ref 1.12–1.23)
HCT: 39 % (ref 36.0–46.0)
Hemoglobin: 13.3 g/dL (ref 12.0–15.0)
Sodium: 141 mEq/L (ref 135–145)
TCO2: 23 mmol/L (ref 0–100)

## 2011-11-09 LAB — CBC WITH DIFFERENTIAL/PLATELET
Basophils Relative: 0 % (ref 0–1)
Eosinophils Absolute: 0 10*3/uL (ref 0.0–0.7)
Eosinophils Relative: 1 % (ref 0–5)
Hemoglobin: 11.8 g/dL — ABNORMAL LOW (ref 12.0–15.0)
Lymphs Abs: 2.7 10*3/uL (ref 0.7–4.0)
MCH: 22.3 pg — ABNORMAL LOW (ref 26.0–34.0)
MCHC: 31.7 g/dL (ref 30.0–36.0)
MCV: 70.3 fL — ABNORMAL LOW (ref 78.0–100.0)
Monocytes Absolute: 0.5 10*3/uL (ref 0.1–1.0)
Monocytes Relative: 7 % (ref 3–12)
Neutrophils Relative %: 56 % (ref 43–77)

## 2011-11-09 LAB — URINALYSIS, ROUTINE W REFLEX MICROSCOPIC
Bilirubin Urine: NEGATIVE
Hgb urine dipstick: NEGATIVE
Ketones, ur: NEGATIVE mg/dL
Nitrite: NEGATIVE
pH: 6.5 (ref 5.0–8.0)

## 2011-11-09 LAB — TROPONIN I: Troponin I: <0.3 ng/mL (ref <0.30)

## 2011-11-09 MED ORDER — IOHEXOL 350 MG/ML SOLN
100.0000 mL | Freq: Once | INTRAVENOUS | Status: AC | PRN
Start: 2011-11-09 — End: 2011-11-09
  Administered 2011-11-09: 100 mL via INTRAVENOUS

## 2011-11-09 MED ORDER — SODIUM CHLORIDE 0.9 % IV BOLUS (SEPSIS)
500.0000 mL | Freq: Once | INTRAVENOUS | Status: AC
  Administered 2011-11-09: 500 mL via INTRAVENOUS

## 2011-11-09 NOTE — ED Notes (Signed)
"  funny feeling" in chest and just feel weird, this is how I felt when I had blood clots"  Started after walking around, in heat for a little while,

## 2011-11-09 NOTE — ED Notes (Signed)
Patient states symptoms started around 1 pm today, states similar episode few years ago, was diagnosed with PE's-denies CP, although states SOB, states chest tight-denies pain with deep inspiration-tearful, anxious-will continue to monitor

## 2011-11-09 NOTE — ED Notes (Signed)
Bed:WA17<BR> Expected date:<BR> Expected time:<BR> Means of arrival:<BR> Comments:<BR> Hold for triage 2

## 2011-11-09 NOTE — ED Notes (Signed)
Off floor for testing 

## 2011-11-09 NOTE — ED Provider Notes (Signed)
History     CSN: [id number]  Arrival date & time 11/09/11  1335   First MD Initiated Contact with Patient 11/09/11 1538      Chief Complaint  Patient presents with  . heart fluttering   . Chest Pain    (Consider location/radiation/quality/duration/timing/severity/associated sxs/prior treatment) Patient is a 46 y.o. female presenting with chest pain. The history is provided by the patient.  Chest Pain Primary symptoms include shortness of breath. Pertinent negatives for primary symptoms include no abdominal pain, no nausea and no vomiting.  Pertinent negatives for associated symptoms include no numbness and no weakness.    patient states that she began to feel some chest tightness and fluttering feeling in her chest. It began all she is inside but she had been outside walking around. She states she was not too much exertion. He states it feels like when she had her pulmonary embolisms previously. She states they never found a cause and did a hypercoagulable workup without a clear reason. She is no longer on blood thinners. The clots for 4 years ago. No lightheadedness or dizziness. She states she feels a little short of breath. She states it felt as if something by her heart was flipping around. She states she previously been told she had arrhythmia and was told she had PVCs. No recent travel. She does not smoke. She's not on hormone therapy.  Past Medical History  Diagnosis Date  . Anemia   . Clotting disorder   . Acute constipation   . Abdominal pain, periumbilical     Past Surgical History  Procedure Date  . Abdominal surgery january 2011    internal bleeding   . Abdominal hysterectomy December 2010    History reviewed. No pertinent family history.  History  Substance Use Topics  . Smoking status: Never Smoker   . Smokeless tobacco: Never Used  . Alcohol Use: No    OB History    Grav Para Term Preterm Abortions TAB SAB Ect Mult Living                  Review of  Systems  Constitutional: Negative for activity change and appetite change.  HENT: Negative for neck stiffness.   Eyes: Negative for pain.  Respiratory: Positive for shortness of breath. Negative for chest tightness.   Cardiovascular: Positive for chest pain. Negative for leg swelling.  Gastrointestinal: Negative for nausea, vomiting, abdominal pain and diarrhea.  Genitourinary: Negative for flank pain.  Musculoskeletal: Negative for back pain.  Skin: Negative for rash.  Neurological: Negative for weakness, numbness and headaches.  Psychiatric/Behavioral: Negative for behavioral problems.    Allergies  Review of patient's allergies indicates no known allergies.  Home Medications  No current outpatient prescriptions on file.  BP 101/54  Pulse 63  Temp 98.5 F (36.9 C) (Oral)  Resp 16  SpO2 100%  Physical Exam  Nursing note and vitals reviewed. Constitutional: She is oriented to person, place, and time. She appears well-developed and well-nourished.  HENT:  Head: Normocephalic and atraumatic.  Eyes: EOM are normal. Pupils are equal, round, and reactive to light.  Neck: Normal range of motion. Neck supple.  Cardiovascular: Normal rate, regular rhythm and normal heart sounds.   No murmur heard. Pulmonary/Chest: Effort normal and breath sounds normal. No respiratory distress. She has no wheezes. She has no rales.  Abdominal: Soft. Bowel sounds are normal. She exhibits no distension. There is no tenderness. There is no rebound and no guarding.  Musculoskeletal: Normal range  of motion.  Neurological: She is alert and oriented to person, place, and time. No cranial nerve deficit.  Skin: Skin is warm and dry.  Psychiatric: She has a normal mood and affect. Her speech is normal.    ED Course  Procedures (including critical care time)  Labs Reviewed  URINALYSIS, ROUTINE W REFLEX MICROSCOPIC - Abnormal; Notable for the following:    APPearance CLOUDY (*)     All other components  within normal limits  CBC WITH DIFFERENTIAL - Abnormal; Notable for the following:    RBC 5.29 (*)     Hemoglobin 11.8 (*)     MCV 70.3 (*)     MCH 22.3 (*)     All other components within normal limits  TROPONIN I  POCT I-STAT, CHEM 8   Ct Angio Chest W/cm &/or Wo Cm  11/09/2011  *RADIOLOGY REPORT*  Clinical Data: Chest pain.  CT ANGIOGRAPHY CHEST  Technique:  Multidetector CT imaging of the chest using the standard protocol during bolus administration of intravenous contrast. Multiplanar reconstructed images including MIPs were obtained and reviewed to evaluate the vascular anatomy.  Contrast: OMNIPAQUE IOHEXOL 350 MG/ML SOLN  Comparison: 07/03/2010  Findings: No filling defects in the pulmonary arteries to suggest pulmonary emboli. Heart is normal size. Aorta is normal caliber. No mediastinal, hilar, or axillary adenopathy.  Visualized thyroid and chest wall soft tissues unremarkable. Lungs are clear.  No focal airspace opacities or suspicious nodules.  No effusions. Imaging into the upper abdomen shows no acute findings.  No acute bony abnormality.  IMPRESSION: No evidence of pulmonary embolus.  No acute findings.   Original Report Authenticated By: Cyndie Chime, M.D.      1. Chest pain      Date: 11/09/2011  Rate: 65  Rhythm: normal sinus rhythm  QRS Axis: normal  Intervals: normal  ST/T Wave abnormalities: nonspecific ST/T changes  Conduction Disutrbances:none  Narrative Interpretation:   Old EKG Reviewed: unchanged    MDM  Patient with fluttering and pressure in her chest. States it felt like her previous blood clots. Patient has negative troponin and negative CT angio. She was discharged home.        Juliet Rude. Rubin Payor, MD 11/10/11 1610

## 2014-06-09 ENCOUNTER — Other Ambulatory Visit: Payer: Self-pay | Admitting: Medical

## 2015-09-22 ENCOUNTER — Ambulatory Visit (HOSPITAL_COMMUNITY)
Admission: EM | Admit: 2015-09-22 | Discharge: 2015-09-22 | Disposition: A | Discharge status: Home / Self Care | Payer: BLUE CROSS/BLUE SHIELD | Attending: Family Medicine | Admitting: Family Medicine

## 2015-09-22 ENCOUNTER — Encounter (HOSPITAL_COMMUNITY): Payer: Self-pay | Admitting: Emergency Medicine

## 2015-09-22 DIAGNOSIS — J300 Vasomotor rhinitis: Secondary | ICD-10-CM

## 2015-09-22 HISTORY — DX: Other pulmonary embolism without acute cor pulmonale: I26.99

## 2015-09-22 MED ORDER — IPRATROPIUM BROMIDE 0.06 % NA SOLN: 2.0000 | Freq: Four times a day (QID) | NASAL | 1 refills | Status: DC

## 2015-09-22 MED ORDER — GUAIFENESIN-CODEINE 100-10 MG/5ML PO SYRP: 10.0000 mL | ORAL_SOLUTION | Freq: Four times a day (QID) | ORAL | 0 refills | Status: DC | PRN

## 2015-09-22 NOTE — ED Triage Notes (Signed)
PT reports sore throat, cough, and bilateral ear pain. PT reports cough is productive of green sputum and is especially bad at night. PT has tried OTC treatments for 1 week.

## 2015-09-22 NOTE — ED Provider Notes (Signed)
[place]    CSN: [id number] Arrival date & time: 09/22/15  [id number]  First Provider Contact:  First MD Initiated Contact with Patient 09/22/15 1958        History   Chief Complaint Chief Complaint  Patient presents with  . Cough  . Sore Throat    HPI Kristy [name] is a 50 y.o. female.    URI  Presenting symptoms: congestion, cough, rhinorrhea and sore throat   Presenting symptoms: no fever   Severity:  Mild Duration:  1 week Progression:  Unchanged Chronicity:  New Relieved by:  None tried Worsened by:  Nothing Ineffective treatments:  None tried Associated symptoms: no wheezing     Past Medical History:  Diagnosis Date  . Abdominal pain, periumbilical   . Acute constipation   . Anemia   . Clotting disorder ([place])   . Pulmonary embolism ([place]) 2009    Patient Active Problem List   Diagnosis Date Noted  . Abdominal pain, generalized 11/25/2010  . Diastasis recti 11/25/2010    Past Surgical History:  Procedure Laterality Date  . ABDOMINAL HYSTERECTOMY  December 2010  . ABDOMINAL SURGERY  january 2011   internal bleeding     OB History    No data available       Home Medications    Prior to Admission medications   Not on File    Family History No family history on file.  Social History Social History  Substance Use Topics  . Smoking status: Never Smoker  . Smokeless tobacco: Never Used  . Alcohol use No     Allergies   Review of patient's allergies indicates no known allergies.   Review of Systems Review of Systems  Constitutional: Negative.  Negative for fever.  HENT: Positive for congestion, postnasal drip, rhinorrhea and sore throat. Negative for ear discharge.   Respiratory: Positive for cough. Negative for shortness of breath and wheezing.   Cardiovascular: Negative.   All other systems reviewed and are negative.    Physical Exam Triage Vital Signs ED Triage Vitals  Enc Vitals Group     BP 09/22/15  1935 119/72     Pulse Rate 09/22/15 1935 65     Resp --      Temp 09/22/15 1935 98.5 F (36.9 C)     Temp Source 09/22/15 1935 Oral     SpO2 09/22/15 1935 98 %     Weight 09/22/15 1935 190 lb (86.2 kg)     Height 09/22/15 1935 5' 8.5" (1.74 m)     Head Circumference --      Peak Flow --      Pain Score 09/22/15 1946 6     Pain Loc --      Pain Edu? --      Excl. in [place]? --    No data found.   Updated Vital Signs BP 119/72 (BP Location: Left Arm)   Pulse 65   Temp 98.5 F (36.9 C) (Oral)   Ht 5' 8.5" (1.74 m)   Wt 190 lb (86.2 kg)   SpO2 98%   BMI 28.47 kg/m   Visual Acuity Right Eye Distance:   Left Eye Distance:   Bilateral Distance:    Right Eye Near:   Left Eye Near:    Bilateral Near:     Physical Exam  Constitutional: She appears well-developed and well-nourished.  HENT:  Mouth/Throat: Oropharynx is clear and moist.  bilat cerumen impaction   Neck: Normal range of  motion. Neck supple.  Cardiovascular: Normal rate and normal heart sounds.   Pulmonary/Chest: Effort normal and breath sounds normal.  Lymphadenopathy:    She has no cervical adenopathy.  Skin: Skin is warm.  Nursing note and vitals reviewed.    UC Treatments / Results  Labs (all labs ordered are listed, but only abnormal results are displayed) Labs Reviewed - No data to display  EKG  EKG Interpretation None       Radiology No results found.  Procedures Procedures (including critical care time)  Medications Ordered in UC Medications - No data to display   Initial Impression / Assessment and Plan / UC Course  I have reviewed the triage vital signs and the nursing notes.  Pertinent labs & imaging results that were available during my care of the patient were reviewed by me and considered in my medical decision making (see chart for details).  Clinical Course      Final Clinical Impressions(s) / UC Diagnoses   Final diagnoses:  None    New Prescriptions New  Prescriptions   No medications on file     Billy Fischer, MD 09/22/15 2015

## 2016-09-13 ENCOUNTER — Other Ambulatory Visit: Payer: Self-pay | Admitting: Nurse Practitioner

## 2016-09-13 DIAGNOSIS — R1084 Generalized abdominal pain: Secondary | ICD-10-CM

## 2016-09-20 ENCOUNTER — Ambulatory Visit
Admission: RE | Admit: 2016-09-20 | Discharge: 2016-09-20 | Disposition: A | Discharge status: Home / Self Care | Payer: BLUE CROSS/BLUE SHIELD | Source: Ambulatory Visit | Attending: Nurse Practitioner | Admitting: Nurse Practitioner

## 2016-09-20 DIAGNOSIS — R1084 Generalized abdominal pain: Secondary | ICD-10-CM

## 2017-03-17 DIAGNOSIS — R7309 Other abnormal glucose: Secondary | ICD-10-CM | POA: Diagnosis not present

## 2017-03-17 DIAGNOSIS — E785 Hyperlipidemia, unspecified: Secondary | ICD-10-CM | POA: Diagnosis not present

## 2017-06-06 DIAGNOSIS — R0609 Other forms of dyspnea: Secondary | ICD-10-CM | POA: Diagnosis not present

## 2017-06-06 DIAGNOSIS — R079 Chest pain, unspecified: Secondary | ICD-10-CM | POA: Diagnosis not present

## 2017-06-06 DIAGNOSIS — R0789 Other chest pain: Secondary | ICD-10-CM | POA: Diagnosis not present

## 2017-06-06 DIAGNOSIS — D509 Iron deficiency anemia, unspecified: Secondary | ICD-10-CM | POA: Diagnosis not present

## 2017-06-06 DIAGNOSIS — R42 Dizziness and giddiness: Secondary | ICD-10-CM | POA: Diagnosis not present

## 2017-06-06 DIAGNOSIS — R0602 Shortness of breath: Secondary | ICD-10-CM | POA: Diagnosis not present

## 2017-06-07 DIAGNOSIS — R079 Chest pain, unspecified: Secondary | ICD-10-CM | POA: Diagnosis not present

## 2017-06-07 DIAGNOSIS — R001 Bradycardia, unspecified: Secondary | ICD-10-CM | POA: Diagnosis not present

## 2017-11-17 DIAGNOSIS — Z1231 Encounter for screening mammogram for malignant neoplasm of breast: Secondary | ICD-10-CM | POA: Diagnosis not present

## 2017-11-17 DIAGNOSIS — Z803 Family history of malignant neoplasm of breast: Secondary | ICD-10-CM | POA: Diagnosis not present

## 2018-02-08 ENCOUNTER — Encounter (HOSPITAL_COMMUNITY): Payer: Self-pay

## 2018-02-08 ENCOUNTER — Ambulatory Visit (HOSPITAL_COMMUNITY)
Admission: EM | Admit: 2018-02-08 | Discharge: 2018-02-08 | Disposition: A | Discharge status: Home / Self Care | Payer: 59 | Attending: Family Medicine | Admitting: Family Medicine

## 2018-02-08 DIAGNOSIS — R300 Dysuria: Secondary | ICD-10-CM | POA: Diagnosis not present

## 2018-02-08 DIAGNOSIS — R350 Frequency of micturition: Secondary | ICD-10-CM | POA: Insufficient documentation

## 2018-02-08 LAB — POCT URINALYSIS DIP (DEVICE)
BILIRUBIN URINE: NEGATIVE
Glucose, UA: NEGATIVE mg/dL
HGB URINE DIPSTICK: NEGATIVE
KETONES UR: NEGATIVE mg/dL
LEUKOCYTES UA: NEGATIVE
Nitrite: NEGATIVE
PROTEIN: NEGATIVE mg/dL
SPECIFIC GRAVITY, URINE: 1.025 (ref 1.005–1.030)
Urobilinogen, UA: 0.2 mg/dL (ref 0.0–1.0)
pH: 5.5 (ref 5.0–8.0)

## 2018-02-08 NOTE — Discharge Instructions (Signed)
Urine did not show signs of infection Urine culture sent.  We will call you with the results.   Push fluids and get plenty of rest.   Follow up with PCP if symptoms persists Return here or go to ER if you have any new or worsening symptoms such as fever, worsening abdominal pain, nausea/vomiting, flank pain, etc..Marland Kitchen

## 2018-02-08 NOTE — ED Provider Notes (Signed)
[facility]   CC: burning and frequency with urination  SUBJECTIVE:  Kristy [name] is a 52 y.o. female who complains of improving urinary frequency and dysuria for the past x 8 days.  Does admit to recent change in bath products.  Denies abdominal or flank pain.  Has tried OTC medications including AZO and pushing fluids with relief.  Symptoms are made worse with urination.  Admits to similar symptoms in the past.  Denies fever, chills, nausea, vomiting, abdominal pain, flank pain, abnormal vaginal discharge or bleeding, hematuria.    LMP: No LMP recorded. Patient has had a hysterectomy.  ROS: As in HPI.  Past Medical History:  Diagnosis Date  . Abdominal pain, periumbilical   . Acute constipation   . Anemia   . Clotting disorder ([place])   . Pulmonary embolism ([place]) 2009   Past Surgical History:  Procedure Laterality Date  . ABDOMINAL HYSTERECTOMY  December 2010  . ABDOMINAL SURGERY  january 2011   internal bleeding    No Known Allergies No current facility-administered medications on file prior to encounter.    No current outpatient medications on file prior to encounter.   Social History   Socioeconomic History  . Marital status: Married    Spouse name: Not on file  . Number of children: Not on file  . Years of education: Not on file  . Highest education level: Not on file  Occupational History  . Not on file  Social Needs  . Financial resource strain: Not on file  . Food insecurity:    Worry: Not on file    Inability: Not on file  . Transportation needs:    Medical: Not on file    Non-medical: Not on file  Tobacco Use  . Smoking status: Never Smoker  . Smokeless tobacco: Never Used  Substance and Sexual Activity  . Alcohol use: No  . Drug use: No  . Sexual activity: Not on file  Lifestyle  . Physical activity:    Days per week: Not on file    Minutes per session: Not on file  . Stress: Not on file  Relationships  . Social connections:     Talks on phone: Not on file    Gets together: Not on file    Attends religious service: Not on file    Active member of club or organization: Not on file    Attends meetings of clubs or organizations: Not on file    Relationship status: Not on file  . Intimate partner violence:    Fear of current or ex partner: Not on file    Emotionally abused: Not on file    Physically abused: Not on file    Forced sexual activity: Not on file  Other Topics Concern  . Not on file  Social History Narrative  . Not on file   History reviewed. No pertinent family history.  OBJECTIVE:  Vitals:   02/08/18 1032  BP: (!) 107/51  Pulse: 65  Resp: 16  Temp: 97.9 F (36.6 C)  TempSrc: Oral  SpO2: 99%   General appearance: AOx3 in no acute distress HEENT: NCAT.  Oropharynx clear.  Lungs: clear to auscultation bilaterally without adventitious breath sounds Heart: regular rate and rhythm.  Radial pulses 2+ symmetrical bilaterally Abdomen: soft; non-distended; no tenderness; bowel sounds present; no guarding Back: no CVA tenderness Extremities: no edema; symmetrical with no gross deformities Skin: warm and dry Neurologic: Ambulates from chair to exam table without difficulty Psychological:  alert and cooperative; normal mood and affect  Labs Reviewed  POCT URINALYSIS DIP (DEVICE)   Results for orders placed or performed during the hospital encounter of 02/08/18 (from the past 24 hour(s))  POCT urinalysis dip (device)     Status: None   Collection Time: 02/08/18 10:34 AM  Result Value Ref Range   Glucose, UA NEGATIVE NEGATIVE mg/dL   Bilirubin Urine NEGATIVE NEGATIVE   Ketones, ur NEGATIVE NEGATIVE mg/dL   Specific Gravity, Urine 1.025 1.005 - 1.030   Hgb urine dipstick NEGATIVE NEGATIVE   pH 5.5 5.0 - 8.0   Protein, ur NEGATIVE NEGATIVE mg/dL   Urobilinogen, UA 0.2 0.0 - 1.0 mg/dL   Nitrite NEGATIVE NEGATIVE   Leukocytes, UA NEGATIVE NEGATIVE    ASSESSMENT & PLAN:  1. Dysuria   2.  Urinary frequency    Urine did not show signs of infection Urine culture sent.  We will call you with the results.   Push fluids and get plenty of rest.   Follow up with PCP if symptoms persists Return here or go to ER if you have any new or worsening symptoms such as fever, worsening abdominal pain, nausea/vomiting, flank pain, etc...  Outlined signs and symptoms indicating need for more acute intervention. Patient verbalized understanding. After Visit Summary given.     Lestine Box, PA-C 02/08/18 1152

## 2018-02-08 NOTE — ED Triage Notes (Signed)
Pt presents burning while urinating and frequency to the bathroom.  Pt symptoms started last Wednesday.

## 2018-03-07 ENCOUNTER — Ambulatory Visit (INDEPENDENT_AMBULATORY_CARE_PROVIDER_SITE_OTHER): Payer: 59 | Admitting: Nurse Practitioner

## 2018-03-07 ENCOUNTER — Encounter: Payer: Self-pay | Admitting: Nurse Practitioner

## 2018-03-07 VITALS — BP 110/70 | HR 77 | Temp 98.0°F | Ht 67.6 in | Wt 199.8 lb

## 2018-03-07 DIAGNOSIS — R7303 Prediabetes: Secondary | ICD-10-CM | POA: Insufficient documentation

## 2018-03-07 DIAGNOSIS — Z0000 Encounter for general adult medical examination without abnormal findings: Secondary | ICD-10-CM

## 2018-03-07 DIAGNOSIS — E669 Obesity, unspecified: Secondary | ICD-10-CM | POA: Insufficient documentation

## 2018-03-07 DIAGNOSIS — E559 Vitamin D deficiency, unspecified: Secondary | ICD-10-CM

## 2018-03-07 DIAGNOSIS — Z139 Encounter for screening, unspecified: Secondary | ICD-10-CM | POA: Diagnosis not present

## 2018-03-07 DIAGNOSIS — Z23 Encounter for immunization: Secondary | ICD-10-CM | POA: Diagnosis not present

## 2018-03-07 LAB — POCT URINALYSIS DIPSTICK
BILIRUBIN UA: NEGATIVE
Blood, UA: NEGATIVE
GLUCOSE UA: NEGATIVE
Ketones, UA: NEGATIVE
Leukocytes, UA: NEGATIVE
Nitrite, UA: NEGATIVE
PH UA: 5.5 (ref 5.0–8.0)
Protein, UA: NEGATIVE
Spec Grav, UA: 1.02 (ref 1.010–1.025)
Urobilinogen, UA: 0.2 E.U./dL

## 2018-03-07 MED ORDER — TETANUS-DIPHTH-ACELL PERTUSSIS 5-2.5-18.5 LF-MCG/0.5 IM SUSP
0.5000 mL | Freq: Once | INTRAMUSCULAR | Status: AC
  Administered 2018-03-07: 0.5 mL via INTRAMUSCULAR

## 2018-03-07 NOTE — Patient Instructions (Signed)
Health Maintenance, Female Adopting a healthy lifestyle and getting preventive care can go a long way to promote health and wellness. Talk with your health care provider about what schedule of regular examinations is right for you. This is a good chance for you to check in with your provider about disease prevention and staying healthy. In between checkups, there are plenty of things you can do on your own. Experts have done a lot of research about which lifestyle changes and preventive measures are most likely to keep you healthy. Ask your health care provider for more information. Weight and diet Eat a healthy diet  Be sure to include plenty of vegetables, fruits, low-fat dairy products, and lean protein.  Do not eat a lot of foods high in solid fats, added sugars, or salt.  Get regular exercise. This is one of the most important things you can do for your health. ? Most adults should exercise for at least 150 minutes each week. The exercise should increase your heart rate and make you sweat (moderate-intensity exercise). ? Most adults should also do strengthening exercises at least twice a week. This is in addition to the moderate-intensity exercise. Maintain a healthy weight  Body mass index (BMI) is a measurement that can be used to identify possible weight problems. It estimates body fat based on height and weight. Your health care provider can help determine your BMI and help you achieve or maintain a healthy weight.  For females 5 years of age and older: ? A BMI below 18.5 is considered underweight. ? A BMI of 18.5 to 24.9 is normal. ? A BMI of 25 to 29.9 is considered overweight. ? A BMI of 30 and above is considered obese. Watch levels of cholesterol and blood lipids  You should start having your blood tested for lipids and cholesterol at 54 years of age, then have this test every 5 years.  You may need to have your cholesterol levels checked more often if: ? Your lipid or  cholesterol levels are high. ? You are older than 54 years of age. ? You are at high risk for heart disease. Cancer screening Lung Cancer  Lung cancer screening is recommended for adults 48-79 years old who are at high risk for lung cancer because of a history of smoking.  A yearly low-dose CT scan of the lungs is recommended for people who: ? Currently smoke. ? Have quit within the past 15 years. ? Have at least a 30-pack-year history of smoking. A pack year is smoking an average of one pack of cigarettes a day for 1 year.  Yearly screening should continue until it has been 15 years since you quit.  Yearly screening should stop if you develop a health problem that would prevent you from having lung cancer treatment. Breast Cancer  Practice breast self-awareness. This means understanding how your breasts normally appear and feel.  It also means doing regular breast self-exams. Let your health care provider know about any changes, no matter how small.  If you are in your 20s or 30s, you should have a clinical breast exam (CBE) by a health care provider every 1-3 years as part of a regular health exam.  If you are 22 or older, have a CBE every year. Also consider having a breast X-ray (mammogram) every year.  If you have a family history of breast cancer, talk to your health care provider about genetic screening.  If you are at high risk for breast cancer, talk  to your health care provider about having an MRI and a mammogram every year.  Breast cancer gene (BRCA) assessment is recommended for women who have family members with BRCA-related cancers. BRCA-related cancers include: ? Breast. ? Ovarian. ? Tubal. ? Peritoneal cancers.  Results of the assessment will determine the need for genetic counseling and BRCA1 and BRCA2 testing. Cervical Cancer Your health care provider may recommend that you be screened regularly for cancer of the pelvic organs (ovaries, uterus, and vagina).  This screening involves a pelvic examination, including checking for microscopic changes to the surface of your cervix (Pap test). You may be encouraged to have this screening done every 3 years, beginning at age 21.  For women ages 30-65, health care providers may recommend pelvic exams and Pap testing every 3 years, or they may recommend the Pap and pelvic exam, combined with testing for human papilloma virus (HPV), every 5 years. Some types of HPV increase your risk of cervical cancer. Testing for HPV may also be done on women of any age with unclear Pap test results.  Other health care providers may not recommend any screening for nonpregnant women who are considered low risk for pelvic cancer and who do not have symptoms. Ask your health care provider if a screening pelvic exam is right for you.  If you have had past treatment for cervical cancer or a condition that could lead to cancer, you need Pap tests and screening for cancer for at least 20 years after your treatment. If Pap tests have been discontinued, your risk factors (such as having a new sexual partner) need to be reassessed to determine if screening should resume. Some women have medical problems that increase the chance of getting cervical cancer. In these cases, your health care provider may recommend more frequent screening and Pap tests. Colorectal Cancer  This type of cancer can be detected and often prevented.  Routine colorectal cancer screening usually begins at 54 years of age and continues through 54 years of age.  Your health care provider may recommend screening at an earlier age if you have risk factors for colon cancer.  Your health care provider may also recommend using home test kits to check for hidden blood in the stool.  A small camera at the end of a tube can be used to examine your colon directly (sigmoidoscopy or colonoscopy). This is done to check for the earliest forms of colorectal cancer.  Routine  screening usually begins at age 50.  Direct examination of the colon should be repeated every 5-10 years through 54 years of age. However, you may need to be screened more often if early forms of precancerous polyps or small growths are found. Skin Cancer  Check your skin from head to toe regularly.  Tell your health care provider about any new moles or changes in moles, especially if there is a change in a mole's shape or color.  Also tell your health care provider if you have a mole that is larger than the size of a pencil eraser.  Always use sunscreen. Apply sunscreen liberally and repeatedly throughout the day.  Protect yourself by wearing long sleeves, pants, a wide-brimmed hat, and sunglasses whenever you are outside. Heart disease, diabetes, and high blood pressure  High blood pressure causes heart disease and increases the risk of stroke. High blood pressure is more likely to develop in: ? People who have blood pressure in the high end of the normal range (130-139/85-89 mm Hg). ? People   who are overweight or obese. ? People who are African American.  If you are 84-22 years of age, have your blood pressure checked every 3-5 years. If you are 67 years of age or older, have your blood pressure checked every year. You should have your blood pressure measured twice-once when you are at a hospital or clinic, and once when you are not at a hospital or clinic. Record the average of the two measurements. To check your blood pressure when you are not at a hospital or clinic, you can use: ? An automated blood pressure machine at a pharmacy. ? A home blood pressure monitor.  If you are between 52 years and 3 years old, ask your health care provider if you should take aspirin to prevent strokes.  Have regular diabetes screenings. This involves taking a blood sample to check your fasting blood sugar level. ? If you are at a normal weight and have a low risk for diabetes, have this test once  every three years after 54 years of age. ? If you are overweight and have a high risk for diabetes, consider being tested at a younger age or more often. Preventing infection Hepatitis B  If you have a higher risk for hepatitis B, you should be screened for this virus. You are considered at high risk for hepatitis B if: ? You were born in a country where hepatitis B is common. Ask your health care provider which countries are considered high risk. ? Your parents were born in a high-risk country, and you have not been immunized against hepatitis B (hepatitis B vaccine). ? You have HIV or AIDS. ? You use needles to inject street drugs. ? You live with someone who has hepatitis B. ? You have had sex with someone who has hepatitis B. ? You get hemodialysis treatment. ? You take certain medicines for conditions, including cancer, organ transplantation, and autoimmune conditions. Hepatitis C  Blood testing is recommended for: ? Everyone born from [birth year] through 1966. ? Anyone with known risk factors for hepatitis C. Sexually transmitted infections (STIs)  You should be screened for sexually transmitted infections (STIs) including gonorrhea and chlamydia if: ? You are sexually active and are younger than 54 years of age. ? You are older than 55 years of age and your health care provider tells you that you are at risk for this type of infection. ? Your sexual activity has changed since you were last screened and you are at an increased risk for chlamydia or gonorrhea. Ask your health care provider if you are at risk.  If you do not have HIV, but are at risk, it may be recommended that you take a prescription medicine daily to prevent HIV infection. This is called pre-exposure prophylaxis (PrEP). You are considered at risk if: ? You are sexually active and do not regularly use condoms or know the HIV status of your partner(s). ? You take drugs by injection. ? You are sexually active with a partner  who has HIV. Talk with your health care provider about whether you are at high risk of being infected with HIV. If you choose to begin PrEP, you should first be tested for HIV. You should then be tested every 3 months for as long as you are taking PrEP. Pregnancy  If you are premenopausal and you may become pregnant, ask your health care provider about preconception counseling.  If you may become pregnant, take 400 to 800 micrograms (mcg) of folic acid every  day.  If you want to prevent pregnancy, talk to your health care provider about birth control (contraception). Osteoporosis and menopause  Osteoporosis is a disease in which the bones lose minerals and strength with aging. This can result in serious bone fractures. Your risk for osteoporosis can be identified using a bone density scan.  If you are 83 years of age or older, or if you are at risk for osteoporosis and fractures, ask your health care provider if you should be screened.  Ask your health care provider whether you should take a calcium or vitamin D supplement to lower your risk for osteoporosis.  Menopause may have certain physical symptoms and risks.  Hormone replacement therapy may reduce some of these symptoms and risks. Talk to your health care provider about whether hormone replacement therapy is right for you. Follow these instructions at home:  Schedule regular health, dental, and eye exams.  Stay current with your immunizations.  Do not use any tobacco products including cigarettes, chewing tobacco, or electronic cigarettes.  If you are pregnant, do not drink alcohol.  If you are breastfeeding, limit how much and how often you drink alcohol.  Limit alcohol intake to no more than 1 drink per day for nonpregnant women. One drink equals 12 ounces of beer, 5 ounces of wine, or 1 ounces of hard liquor.  Do not use street drugs.  Do not share needles.  Ask your health care provider for help if you need support  or information about quitting drugs.  Tell your health care provider if you often feel depressed.  Tell your health care provider if you have ever been abused or do not feel safe at home. This information is not intended to replace advice given to you by your health care provider. Make sure you discuss any questions you have with your health care provider. Document Released: 08/30/2010 Document Revised: 07/23/2015 Document Reviewed: 11/18/2014 Elsevier Interactive Patient Education  2019 [place] [facility]) Exercise Recommendation  Being physically active is important to prevent heart disease and stroke, the nation's No. 1and No. 5killers. To improve overall cardiovascular health, we suggest at least 150 minutes per week of moderate exercise or 75 minutes per week of vigorous exercise (or a combination of moderate and vigorous activity). Thirty minutes a day, five times a week is an easy goal to remember. You will also experience benefits even if you divide your time into two or three segments of 10 to 15 minutes per day.  For people who would benefit from lowering their blood pressure or cholesterol, we recommend 40 minutes of aerobic exercise of moderate to vigorous intensity three to four times a week to lower the risk for heart attack and stroke.  Physical activity is anything that makes you move your body and burn calories.  This includes things like climbing stairs or playing sports. Aerobic exercises benefit your heart, and include walking, jogging, swimming or biking. Strength and stretching exercises are best for overall stamina and flexibility.  The simplest, positive change you can make to effectively improve your heart health is to start walking. It's enjoyable, free, easy, social and great exercise. A walking program is flexible and boasts high success rates because people can stick with it. It's easy for walking to become a regular and satisfying part of  life.   For Overall Cardiovascular Health:  At least 30 minutes of moderate-intensity aerobic activity at least 5 days per week for a total of 150  OR   At least 25 minutes of vigorous aerobic activity at least 3 days per week for a total of 75 minutes; or a combination of moderate- and vigorous-intensity aerobic activity  AND   Moderate- to high-intensity muscle-strengthening activity at least 2 days per week for additional health benefits.  For Lowering Blood Pressure and Cholesterol  An average 40 minutes of moderate- to vigorous-intensity aerobic activity 3 or 4 times per week  What if I can't make it to the time goal? Something is always better than nothing! And everyone has to start somewhere. Even if you've been sedentary for years, today is the day you can begin to make healthy changes in your life. If you don't think you'll make it for 30 or 40 minutes, set a reachable goal for today. You can work up toward your overall goal by increasing your time as you get stronger. Don't let all-or-nothing thinking rob you of doing what you can every day.  Source:http://www.heart.org    

## 2018-03-07 NOTE — Progress Notes (Signed)
Subjective:     Patient ID: Kristy [name] , female    DOB: February 03, 1966 , 53 y.o.   MRN: [id number]   Chief Complaint  Patient presents with  . Annual Exam   The patient states she uses status post hysterectomy for birth control. Last LMP was No LMP recorded. Patient has had a hysterectomy.. Negative for Dysmenorrhea and Negative for Menorrhagia Mammogram last done September 2019 Negative for: breast discharge, breast lump(s), breast pain and breast self exam.  Pertinent negatives include abnormal bleeding (hematology), anxiety, decreased libido, depression, difficulty falling sleep, dyspareunia, history of infertility, nocturia, sexual dysfunction, sleep disturbances, urinary incontinence, urinary urgency, vaginal discharge and vaginal itching. Diet regular.The patient states her exercise level is  1-2 times week 30 minutes  . The patient's tobacco use is:   Social History   Tobacco Use  Smoking Status Never Smoker  Smokeless Tobacco Never Used  . She has been exposed to passive smoke. The patient's alcohol use is:  Social History   Substance and Sexual Activity  Alcohol Use No   Additional information: Last pap 2018, next one scheduled for 2021  HPI  HPI   Past Medical History:  Diagnosis Date  . Abdominal pain, periumbilical   . Acute constipation   . Anemia   . Clotting disorder ([place])   . Pulmonary embolism ([place]) 2009     No family history on file.  No current outpatient medications on file.   No Known Allergies   Review of Systems  Constitutional: Negative.   HENT: Negative.   Eyes: Negative.   Respiratory: Negative.   Cardiovascular: Negative.   Gastrointestinal: Negative.   Endocrine: Negative.   Genitourinary: Negative.   Musculoskeletal: Negative.   Skin: Negative.   Allergic/Immunologic: Negative.   Neurological: Negative.   Hematological: Negative.   Psychiatric/Behavioral: Negative.      Today's Vitals   03/07/18 0858  BP: 110/70   Pulse: 77  Temp: 98 F (36.7 C)  TempSrc: Oral  SpO2: 97%  Weight: 199 lb 12.8 oz (90.6 kg)  Height: 5' 7.6" (1.717 m)  PainSc: 0-No pain   Body mass index is 30.74 kg/m.   Objective:  Physical Exam Constitutional:      General: She is not in acute distress.    Appearance: Normal appearance. She is well-developed. She is obese.  HENT:     Head: Normocephalic and atraumatic.     Right Ear: Hearing, tympanic membrane, ear canal and external ear normal.     Left Ear: Hearing, tympanic membrane, ear canal and external ear normal.     Nose: Nose normal.     Mouth/Throat:     Mouth: Mucous membranes are moist.  Eyes:     General: Lids are normal.     Conjunctiva/sclera: Conjunctivae normal.     Pupils: Pupils are equal, round, and reactive to light.     Funduscopic exam:    Right eye: No papilledema.        Left eye: No papilledema.  Neck:     Musculoskeletal: Full passive range of motion without pain, normal range of motion and neck supple.     Thyroid: No thyroid mass.     Vascular: No carotid bruit.  Cardiovascular:     Rate and Rhythm: Normal rate and regular rhythm.     Pulses: Normal pulses.     Heart sounds: Normal heart sounds. No murmur.  Pulmonary:     Effort: Pulmonary effort is normal. No respiratory distress.  Breath sounds: Normal breath sounds.  Abdominal:     General: Bowel sounds are normal.     Palpations: Abdomen is soft.     Tenderness: There is no abdominal tenderness.  Musculoskeletal: Normal range of motion.        General: No swelling or tenderness.  Skin:    General: Skin is warm and dry.     Capillary Refill: Capillary refill takes less than 2 seconds.  Neurological:     General: No focal deficit present.     Mental Status: She is alert and oriented to person, place, and time.     Cranial Nerves: No cranial nerve deficit.     Sensory: No sensory deficit.  Psychiatric:        Mood and Affect: Mood normal.        Behavior: Behavior  normal.        Thought Content: Thought content normal.        Judgment: Judgment normal.         Assessment And Plan:     1. Encounter for general adult medical examination w/o abnormal findings . Behavior modifications discussed and diet history reviewed.   . Pt will continue to exercise regularly and modify diet with low GI, plant based foods and decrease intake of processed foods.  . Recommend intake of daily multivitamin, Vitamin D, and calcium.  . Recommend for preventive screenings, as well as recommend immunizations that include influenza, TDAP - POCT Urinalysis Dipstick (81002) - CBC - BMP8+eGFR  2. Encounter for immunization  Will give tetanus vaccine today while in office. Refer to order management. TDAP will be administered to adults 18-64 years old every 10 years. - Tdap (BOOSTRIX) injection 0.5 mL  3. Encounter for screening  Will check HIV - HIV antibody  4. Prediabetes  Chronic, stable  Encouraged to limit intake of sugary foods and drinks  Encouraged to increase physical activity to 150 minutes per week - Hemoglobin A1c  5. Vitamin D deficiency  Will check vitamin D level and supplement as needed.     Also encouraged to spend 15 minutes in the sun daily.  - Vitamin D (25 hydroxy)      Janece Moore, FNP  

## 2018-03-08 LAB — CBC
HEMATOCRIT: 38.3 % (ref 34.0–46.6)
Hemoglobin: 11.6 g/dL (ref 11.1–15.9)
MCH: 21.8 pg — ABNORMAL LOW (ref 26.6–33.0)
MCHC: 30.3 g/dL — AB (ref 31.5–35.7)
MCV: 72 fL — AB (ref 79–97)
PLATELETS: 264 10*3/uL (ref 150–450)
RBC: 5.31 x10E6/uL — ABNORMAL HIGH (ref 3.77–5.28)
RDW: 15.8 % — ABNORMAL HIGH (ref 11.7–15.4)
WBC: 6.9 10*3/uL (ref 3.4–10.8)

## 2018-03-08 LAB — BMP8+EGFR
BUN / CREAT RATIO: 8 — AB (ref 9–23)
BUN: 8 mg/dL (ref 6–24)
CHLORIDE: 103 mmol/L (ref 96–106)
CO2: 22 mmol/L (ref 20–29)
Calcium: 9.3 mg/dL (ref 8.7–10.2)
Creatinine, Ser: 1.04 mg/dL — ABNORMAL HIGH (ref 0.57–1.00)
GFR, EST AFRICAN AMERICAN: 71 mL/min/{1.73_m2} (ref >59)
GFR, EST NON AFRICAN AMERICAN: 61 mL/min/{1.73_m2} (ref >59)
Glucose: 99 mg/dL (ref 65–99)
POTASSIUM: 4.3 mmol/L (ref 3.5–5.2)
SODIUM: 142 mmol/L (ref 134–144)

## 2018-03-08 LAB — HEMOGLOBIN A1C
Est. average glucose Bld gHb Est-mCnc: 134 mg/dL
Hgb A1c MFr Bld: 6.3 % — ABNORMAL HIGH (ref 4.8–5.6)

## 2018-03-08 LAB — VITAMIN D 25 HYDROXY (VIT D DEFICIENCY, FRACTURES): VIT D 25 HYDROXY: 13.2 ng/mL — AB (ref 30.0–100.0)

## 2018-06-11 ENCOUNTER — Ambulatory Visit: Payer: Self-pay | Admitting: Nurse Practitioner

## 2018-06-12 ENCOUNTER — Other Ambulatory Visit: Payer: 59

## 2018-06-12 ENCOUNTER — Other Ambulatory Visit: Payer: Self-pay | Admitting: Nurse Practitioner

## 2018-06-12 ENCOUNTER — Other Ambulatory Visit: Payer: Self-pay

## 2018-06-12 ENCOUNTER — Ambulatory Visit: Payer: Self-pay | Admitting: Nurse Practitioner

## 2018-06-12 DIAGNOSIS — E559 Vitamin D deficiency, unspecified: Secondary | ICD-10-CM

## 2018-06-12 DIAGNOSIS — R7303 Prediabetes: Secondary | ICD-10-CM | POA: Diagnosis not present

## 2018-06-13 LAB — HEMOGLOBIN A1C
Est. average glucose Bld gHb Est-mCnc: 128 mg/dL
Hgb A1c MFr Bld: 6.1 % — ABNORMAL HIGH (ref 4.8–5.6)

## 2018-06-13 LAB — VITAMIN D 25 HYDROXY (VIT D DEFICIENCY, FRACTURES): Vit D, 25-Hydroxy: 13.7 ng/mL — ABNORMAL LOW (ref 30.0–100.0)

## 2018-06-19 ENCOUNTER — Other Ambulatory Visit: Payer: Self-pay

## 2018-06-19 ENCOUNTER — Encounter: Payer: Self-pay | Admitting: Nurse Practitioner

## 2018-06-19 ENCOUNTER — Ambulatory Visit (INDEPENDENT_AMBULATORY_CARE_PROVIDER_SITE_OTHER): Payer: 59 | Admitting: Nurse Practitioner

## 2018-06-19 DIAGNOSIS — R7303 Prediabetes: Secondary | ICD-10-CM | POA: Diagnosis not present

## 2018-06-19 DIAGNOSIS — E559 Vitamin D deficiency, unspecified: Secondary | ICD-10-CM | POA: Diagnosis not present

## 2018-06-19 MED ORDER — VITAMIN D (ERGOCALCIFEROL) 1.25 MG (50000 UNIT) PO CAPS
50000.0000 [IU] | ORAL_CAPSULE | ORAL | 1 refills | Status: DC
Start: 2018-06-21 — End: 2019-02-20

## 2018-06-19 NOTE — Progress Notes (Signed)
Virtual Visit via Video Note    This visit type was conducted due to national recommendations for restrictions regarding the COVID-19 Pandemic (e.g. social distancing) in an effort to limit this patient's exposure and mitigate transmission in our community.  This format is felt to be most appropriate for this patient at this time.  All issues noted in this document were discussed and addressed.  No physical exam was performed (except for noted visual exam findings with Video Visits).  Please refer to the patient's chart (MyChart message for video visits and phone note for telephone visits) for the patient's consent to telehealth for [facility].  Date:  07/09/2018   ID:  Kristy [name], DOB [birth year], MRN [id number]  Patient Location:  Home - spoke with Ellender Hose  Provider location:   Home   Chief Complaint:  Follow up prediabetes  History of Present Illness:    Kristy [name] is a 53 y.o. female who presents via video conferencing for a telehealth visit today.    The patient does not have symptoms concerning for COVID-19 infection (fever, chills, cough, or new shortness of breath).   [place] - for her   [employer] coffee  Weight this morning 196 lbs.    Diabetes  She presents for her follow-up diabetic visit. Diabetes type: prediabetes. Her disease course has been improving. Pertinent negatives for hypoglycemia include no confusion or dizziness. There are no diabetic associated symptoms. There are no diabetic complications.     Past Medical History:  Diagnosis Date  . Abdominal pain, periumbilical   . Acute constipation   . Anemia   . Clotting disorder ([place])   . Pulmonary embolism ([place]) 2009   Past Surgical History:  Procedure Laterality Date  . ABDOMINAL HYSTERECTOMY  December 2010  . ABDOMINAL SURGERY  january 2011   internal bleeding      No outpatient medications have been marked as taking for the 06/19/18 encounter (Office Visit) with [name], [place].      Allergies:   Patient has no known allergies.   Social History   Tobacco Use  . Smoking status: Never Smoker  . Smokeless tobacco: Never Used  Substance Use Topics  . Alcohol use: No  . Drug use: No     Family Hx: The patient's family history is not on file.  ROS:   Please see the history of present illness.    Review of Systems  Constitutional: Negative for chills and fever.  Cardiovascular: Negative.   Neurological: Negative for dizziness and tingling.  Psychiatric/Behavioral: Negative for confusion.    All other systems reviewed and are negative.   Labs/Other Tests and Data Reviewed:    Recent Labs: 03/07/2018: BUN 8; Creatinine, Ser 1.04; Hemoglobin 11.6; Platelets 264; Potassium 4.3; Sodium 142   Recent Lipid Panel No results found for: CHOL, TRIG, HDL, CHOLHDL, LDLCALC, LDLDIRECT  Wt Readings from Last 3 Encounters:  03/07/18 199 lb 12.8 oz (90.6 kg)  09/22/15 190 lb (86.2 kg)  11/25/10 182 lb (82.6 kg)     Exam:    Vital Signs:  There were no vitals taken for this visit.    Physical Exam  Constitutional: She is oriented to person, place, and time.  Pulmonary/Chest: Effort normal.  Neurological: She is alert and oriented to person, place, and time.  Psychiatric: Mood, memory, affect and judgment normal.    ASSESSMENT & PLAN:    1. Vitamin D deficiency  Will check vitamin D level and supplement as needed.  Also encouraged to spend 15 minutes in the sun daily.  - Vitamin D, Ergocalciferol, (DRISDOL) 1.25 MG (50000 UT) CAPS capsule; Take 1 capsule (50,000 Units total) by mouth 2 (two) times a week.  Dispense: 24 capsule; Refill: 1  2. Prediabetes  Chronic, stable  No current medications  Encouraged to limit intake of sugary foods and drinks  Encouraged to increase physical activity to 150 minutes per week     COVID-19 Education: The signs and symptoms of COVID-19 were discussed with the patient and how to seek care for testing  (follow up with PCP or arrange E-visit).  The importance of social distancing was discussed today.  Patient Risk:   After full review of this patients clinical status, I feel that they are at least moderate risk at this time.  Time:   Today, I have spent  Minutes 18 and 20 seconds with the patient with telehealth technology discussing above diagnoses.     Medication Adjustments/Labs and Tests Ordered: Current medicines are reviewed at length with the patient today.  Concerns regarding medicines are outlined above.   Tests Ordered: No orders of the defined types were placed in this encounter.   Medication Changes: Meds ordered this encounter  Medications  . Vitamin D, Ergocalciferol, (DRISDOL) 1.25 MG (50000 UT) CAPS capsule    Sig: Take 1 capsule (50,000 Units total) by mouth 2 (two) times a week.    Dispense:  24 capsule    Refill:  1    Disposition:  Follow up 3 months  Signed, [name], FNP

## 2018-06-20 NOTE — Progress Notes (Signed)
Okay; thanks.

## 2018-07-09 ENCOUNTER — Encounter: Payer: Self-pay | Admitting: Nurse Practitioner

## 2018-09-19 ENCOUNTER — Telehealth: Payer: Self-pay | Admitting: Internal Medicine

## 2018-09-19 NOTE — Telephone Encounter (Signed)
PT CONTACTED TO PRE-SCREEN FOR APPT ON 7/23 NO SYMPTOMS/NO EXPOSURE/NO TRAVEL

## 2018-09-20 ENCOUNTER — Ambulatory Visit (INDEPENDENT_AMBULATORY_CARE_PROVIDER_SITE_OTHER): Payer: 59 | Admitting: Nurse Practitioner

## 2018-09-20 ENCOUNTER — Encounter: Payer: Self-pay | Admitting: Nurse Practitioner

## 2018-09-20 ENCOUNTER — Other Ambulatory Visit: Payer: Self-pay

## 2018-09-20 VITALS — BP 122/62 | HR 68 | Temp 98.3°F | Ht 67.6 in | Wt 198.6 lb

## 2018-09-20 DIAGNOSIS — R7303 Prediabetes: Secondary | ICD-10-CM | POA: Diagnosis not present

## 2018-09-20 DIAGNOSIS — R300 Dysuria: Secondary | ICD-10-CM | POA: Diagnosis not present

## 2018-09-20 DIAGNOSIS — E559 Vitamin D deficiency, unspecified: Secondary | ICD-10-CM

## 2018-09-20 LAB — POCT URINALYSIS DIPSTICK
Bilirubin, UA: NEGATIVE
Blood, UA: NEGATIVE
Glucose, UA: NEGATIVE
Ketones, UA: NEGATIVE
Leukocytes, UA: NEGATIVE
Nitrite, UA: NEGATIVE
Protein, UA: NEGATIVE
Spec Grav, UA: 1.02 (ref 1.010–1.025)
Urobilinogen, UA: 0.2 E.U./dL
pH, UA: 5.5 (ref 5.0–8.0)

## 2018-09-20 NOTE — Progress Notes (Signed)
Subjective:     Patient ID: Kristy [name] , female    DOB: [birth year] , 53 y.o.   MRN: [id number]   Chief Complaint  Patient presents with  . vitamin d    HPI  Here for vitamin d 50,000 supplement - she is more consistent   Dysuria  This is a recurrent problem. The current episode started in the past 7 days. The problem occurs intermittently. The patient is experiencing no pain. There has been no fever. She is not sexually active. There is no history of pyelonephritis. Pertinent negatives include no chills, flank pain, frequency, hematuria or nausea. She has tried nothing for the symptoms. The treatment provided no relief. There is no history of catheterization or recurrent UTIs.     Past Medical History:  Diagnosis Date  . Abdominal pain, periumbilical   . Acute constipation   . Anemia   . Clotting disorder ([place])   . Pulmonary embolism ([place]) 2009     Family History  Problem Relation Age of Onset  . Cancer Mother   . Cancer Father      Current Outpatient Medications:  Marland Kitchen  Vitamin D, Ergocalciferol, (DRISDOL) 1.25 MG (50000 UT) CAPS capsule, Take 1 capsule (50,000 Units total) by mouth 2 (two) times a week., Disp: 24 capsule, Rfl: 1   No Known Allergies   Review of Systems  Constitutional: Negative.  Negative for chills.  Respiratory: Negative.   Cardiovascular: Negative.  Negative for chest pain and palpitations.  Gastrointestinal: Negative for nausea.  Genitourinary: Positive for dysuria. Negative for flank pain, frequency and hematuria.  Musculoskeletal: Negative for arthralgias and myalgias.  Skin: Negative.   Neurological: Negative for dizziness and headaches.     Today's Vitals   09/20/18 0858  BP: 122/62  Pulse: 68  Temp: 98.3 F (36.8 C)  TempSrc: Oral  SpO2: 97%  Weight: 198 lb 9.6 oz (90.1 kg)  Height: 5' 7.6" (1.717 m)   Body mass index is 30.56 kg/m.   Objective:  Physical Exam Constitutional:      Appearance: Normal appearance.   Cardiovascular:     Rate and Rhythm: Normal rate and regular rhythm.     Pulses: Normal pulses.     Heart sounds: Normal heart sounds. No murmur.  Pulmonary:     Effort: Pulmonary effort is normal. No respiratory distress.     Breath sounds: Normal breath sounds.  Skin:    Capillary Refill: Capillary refill takes less than 2 seconds.  Neurological:     General: No focal deficit present.     Mental Status: She is alert and oriented to person, place, and time.  Psychiatric:        Mood and Affect: Mood normal.        Behavior: Behavior normal.        Thought Content: Thought content normal.        Judgment: Judgment normal.         Assessment And Plan:     1. Vitamin D deficiency Will check vitamin D level and supplement as needed.    Also encouraged to spend 15 minutes in the sun daily.  - Vitamin D (25 hydroxy)  2. Prediabetes  Chronic, stable  No current medications  Encouraged to limit intake of sugary foods and drinks  Encouraged to increase physical activity to 150 minutes per week - Hemoglobin A1c; Future  3. Dysuria  Will check urinalysis  Encouraged to increase her water intake and can take AZO  Kristy Enge, FNP    THE PATIENT IS ENCOURAGED TO PRACTICE SOCIAL DISTANCING DUE TO THE COVID-19 PANDEMIC.   

## 2018-09-20 NOTE — Patient Instructions (Signed)
Vitamin D Deficiency Vitamin D deficiency is when your body does not have enough vitamin D. Vitamin D is important to your body because:  It helps your body use other minerals.  It helps to keep your bones strong and healthy.  It may help to prevent some diseases.  It helps your heart and other muscles work well. Not getting enough vitamin D can make your bones soft. It can also cause other health problems. What are the causes? This condition may be caused by:  Not eating enough foods that contain vitamin D.  Not getting enough sun.  Having diseases that make it hard for your body to absorb vitamin D.  Having a surgery in which a part of the stomach or a part of the small intestine is removed.  Having kidney disease or liver disease. What increases the risk? You are more likely to get this condition if:  You are older.  You do not spend much time outdoors.  You live in a nursing home.  You have had broken bones.  You have weak or thin bones (osteoporosis).  You have a disease or condition that changes how your body absorbs vitamin D.  You have dark skin.  You take certain medicines.  You are overweight or obese. What are the signs or symptoms?  In mild cases, there may not be any symptoms. If the condition is very bad, symptoms may include: ? Bone pain. ? Muscle pain. ? Falling often. ? Broken bones caused by a minor injury. How is this treated? Treatment may include taking supplements as told by your doctor. Your doctor will tell you what dose is best for you. Supplements may include:  Vitamin D.  Calcium. Follow these instructions at home: Eating and drinking   Eat foods that contain vitamin D, such as: ? Dairy products, cereals, or juices with added vitamin D. Check the label. ? Fish, such as salmon or trout. ? Eggs. ? Oysters. ? Mushrooms. The items listed above may not be a complete list of what you can eat and drink. Contact a dietitian for more  options. General instructions  Take medicines and supplements only as told by your doctor.  Get regular, safe exposure to natural sunlight.  Do not use a tanning bed.  Maintain a healthy weight. Lose weight if needed.  Keep all follow-up visits as told by your doctor. This is important. How is this prevented?  You can get vitamin D by: ? Eating foods that naturally contain vitamin D. ? Eating or drinking products that have vitamin D added to them, such as cereals, juices, and milk. ? Taking vitamin D or a multivitamin that contains vitamin D. ? Being in the sun. Your body makes vitamin D when your skin is exposed to sunlight. Your body changes the sunlight into a form of the vitamin that it can use. Contact a doctor if:  Your symptoms do not go away.  You feel sick to your stomach (nauseous).  You throw up (vomit).  You poop less often than normal, or you have trouble pooping (constipation). Summary  Vitamin D deficiency is when your body does not have enough vitamin D.  Vitamin D helps to keep your bones strong and healthy.  This condition is often treated by taking a supplement.  Your doctor will tell you what dose is best for you. This information is not intended to replace advice given to you by your health care provider. Make sure you discuss any questions you  have with your health care provider. Document Released: 02/03/2011 Document Revised: 10/23/2017 Document Reviewed: 10/23/2017 Elsevier Patient Education  2020 [employer].

## 2018-09-20 NOTE — Addendum Note (Signed)
Addended by: Roxine Caddy on: 09/20/2018 12:37 PM   Modules accepted: Orders

## 2018-09-21 LAB — HEMOGLOBIN A1C
Est. average glucose Bld gHb Est-mCnc: 131 mg/dL
Hgb A1c MFr Bld: 6.2 % — ABNORMAL HIGH (ref 4.8–5.6)

## 2018-09-21 LAB — VITAMIN D 25 HYDROXY (VIT D DEFICIENCY, FRACTURES): Vit D, 25-Hydroxy: 35.5 ng/mL (ref 30.0–100.0)

## 2018-10-12 ENCOUNTER — Other Ambulatory Visit: Payer: Self-pay

## 2018-10-12 ENCOUNTER — Ambulatory Visit (HOSPITAL_COMMUNITY)
Admission: EM | Admit: 2018-10-12 | Discharge: 2018-10-12 | Disposition: A | Discharge status: Home / Self Care | Payer: 59 | Attending: Emergency Medicine | Admitting: Emergency Medicine

## 2018-10-12 ENCOUNTER — Encounter (HOSPITAL_COMMUNITY): Payer: Self-pay

## 2018-10-12 DIAGNOSIS — H9202 Otalgia, left ear: Secondary | ICD-10-CM

## 2018-10-12 MED ORDER — TRIAMCINOLONE ACETONIDE 55 MCG/ACT NA AERO: 2.0000 | INHALATION_SPRAY | Freq: Every day | NASAL | 0 refills | Status: DC

## 2018-10-12 NOTE — ED Triage Notes (Signed)
Pt presents with earache x 1 day that is worse with chewing. Denies any other symptoms

## 2018-10-12 NOTE — Discharge Instructions (Signed)
It does look like there is congestion behind ear drum which may be causing your symptoms, as your jaw/teeth are not obviously the source. I do not see infection today.  I would recommend ibuprofen, up to 800mg  every 8 hours, to help with pain. Take with food.  Daily nasal spray, best if used regularly, to help with symptoms as well- nasacort or flonase.  An antihistamine may be helpful as well.  If any worsening of symptoms please return for recheck. If localizes to jaw or tooth please follow up with your dentist.

## 2018-10-12 NOTE — ED Provider Notes (Signed)
[facility]    CSN: [id number] Arrival date & time: 10/12/18  1149      History   Chief Complaint Chief Complaint  Patient presents with  . Otalgia    HPI Kristy [name] is a 53 y.o. female.   [name] presents with complaints of left ear pain which started yesterday. Causes some left lower jaw pain as well. Took ibuprofen last night which did take away the pain. No drainage from the ear, no decreased hearing. She has had some congestion lately. No fevers. No dental issues recently, or specific tooth pain. She does follow with a dentist. No known ill contacts. History  Of constipation, PE, clotting disorder     ROS per HPI, negative if not otherwise mentioned.      Past Medical History:  Diagnosis Date  . Abdominal pain, periumbilical   . Acute constipation   . Anemia   . Clotting disorder ([place])   . Pulmonary embolism ([place]) 2009    Patient Active Problem List   Diagnosis Date Noted  . Dysuria 09/20/2018  . Prediabetes 03/07/2018  . Vitamin D deficiency 03/07/2018  . Abdominal pain, generalized 11/25/2010  . Diastasis recti 11/25/2010    Past Surgical History:  Procedure Laterality Date  . ABDOMINAL HYSTERECTOMY  December 2010  . ABDOMINAL SURGERY  january 2011   internal bleeding     OB History   No obstetric history on file.      Home Medications    Prior to Admission medications   Medication Sig Start Date End Date Taking? Authorizing Provider  triamcinolone (NASACORT) 55 MCG/ACT AERO nasal inhaler Place 2 sprays into the nose daily. 10/12/18   Zigmund Gottron, NP  Vitamin D, Ergocalciferol, (DRISDOL) 1.25 MG (50000 UT) CAPS capsule Take 1 capsule (50,000 Units total) by mouth 2 (two) times a week. 06/21/18   [name], FNP    Family History Family History  Problem Relation Age of Onset  . Cancer Mother   . Cancer Father     Social History Social History   Tobacco Use  . Smoking status: Never Smoker  .  Smokeless tobacco: Never Used  Substance Use Topics  . Alcohol use: No  . Drug use: No     Allergies   Patient has no known allergies.   Review of Systems Review of Systems   Physical Exam Triage Vital Signs ED Triage Vitals [10/12/18 1216]  Enc Vitals Group     BP 107/60     Pulse Rate 65     Resp 18     Temp 98.3 F (36.8 C)     Temp src      SpO2 98 %     Weight      Height      Head Circumference      Peak Flow      Pain Score 6     Pain Loc      Pain Edu?      Excl. in [place]?    No data found.  Updated Vital Signs BP 107/60   Pulse 65   Temp 98.3 F (36.8 C)   Resp 18   SpO2 98%    Physical Exam Constitutional:      General: She is not in acute distress.    Appearance: She is well-developed.  HENT:     Head:     Jaw: No swelling, pain on movement or malocclusion.     Comments: Some left  lower jaw tenderness without point tenderness; dentition appears WNL, no visible abscess; good hygeine    Right Ear: Hearing, tympanic membrane, ear canal and external ear normal.     Left Ear: Hearing, ear canal and external ear normal.     Ears:     Comments: Left tm is dull gray with apparent congestion, no redness, bulging or swelling    Nose:     Right Sinus: No maxillary sinus tenderness or frontal sinus tenderness.     Left Sinus: No maxillary sinus tenderness or frontal sinus tenderness.     Mouth/Throat:     Dentition: Normal dentition. No dental tenderness, gingival swelling, dental caries or dental abscesses.  Cardiovascular:     Rate and Rhythm: Normal rate.  Pulmonary:     Effort: Pulmonary effort is normal.  Skin:    General: Skin is warm and dry.  Neurological:     Mental Status: She is alert and oriented to person, place, and time.      UC Treatments / Results  Labs (all labs ordered are listed, but only abnormal results are displayed) Labs Reviewed - No data to display  EKG   Radiology No results found.  Procedures Procedures  (including critical care time)  Medications Ordered in UC Medications - No data to display  Initial Impression / Assessment and Plan / UC Course  I have reviewed the triage vital signs and the nursing notes.  Pertinent labs & imaging results that were available during my care of the patient were reviewed by me and considered in my medical decision making (see chart for details).     No obvious OM on exam today, no obvious dental abnormality or abscess. New onset yesterday, improves with ibuprofen. Encouraged to continue with ibuprofen, may use allergy medication, encouraged steroid nasal spray as well. IM steroid offered to see if this is more helpful, patient declined. Return precautions provided. Patient verbalized understanding and agreeable to plan. . Final Clinical Impressions(s) / UC Diagnoses   Final diagnoses:  Left ear pain     Discharge Instructions     It does look like there is congestion behind ear drum which may be causing your symptoms, as your jaw/teeth are not obviously the source. I do not see infection today.  I would recommend ibuprofen, up to 800mg  every 8 hours, to help with pain. Take with food.  Daily nasal spray, best if used regularly, to help with symptoms as well- nasacort or flonase.  An antihistamine may be helpful as well.  If any worsening of symptoms please return for recheck. If localizes to jaw or tooth please follow up with your dentist.    ED Prescriptions    Medication Sig Dispense Auth. Provider   triamcinolone (NASACORT) 55 MCG/ACT AERO nasal inhaler Place 2 sprays into the nose daily. 1 Inhaler Zigmund Gottron, NP     Controlled Substance Prescriptions [place] Controlled Substance Registry consulted? Not Applicable   Zigmund Gottron, NP 10/12/18 1247

## 2018-11-30 LAB — HM MAMMOGRAPHY

## 2018-12-05 ENCOUNTER — Encounter: Payer: Self-pay | Admitting: Internal Medicine

## 2019-02-20 ENCOUNTER — Other Ambulatory Visit: Payer: Self-pay | Admitting: Nurse Practitioner

## 2019-02-20 DIAGNOSIS — E559 Vitamin D deficiency, unspecified: Secondary | ICD-10-CM

## 2019-03-11 ENCOUNTER — Ambulatory Visit: Payer: 59 | Admitting: Nurse Practitioner

## 2019-03-11 ENCOUNTER — Encounter: Payer: Self-pay | Admitting: Nurse Practitioner

## 2019-03-11 ENCOUNTER — Other Ambulatory Visit: Payer: Self-pay

## 2019-03-11 VITALS — BP 112/70 | HR 65 | Temp 98.3°F | Ht 67.6 in | Wt 197.4 lb

## 2019-03-11 DIAGNOSIS — B009 Herpesviral infection, unspecified: Secondary | ICD-10-CM

## 2019-03-11 DIAGNOSIS — Z114 Encounter for screening for human immunodeficiency virus [HIV]: Secondary | ICD-10-CM | POA: Diagnosis not present

## 2019-03-11 DIAGNOSIS — R21 Rash and other nonspecific skin eruption: Secondary | ICD-10-CM | POA: Diagnosis not present

## 2019-03-11 DIAGNOSIS — Z0001 Encounter for general adult medical examination with abnormal findings: Secondary | ICD-10-CM | POA: Diagnosis not present

## 2019-03-11 DIAGNOSIS — R7303 Prediabetes: Secondary | ICD-10-CM | POA: Diagnosis not present

## 2019-03-11 DIAGNOSIS — E669 Obesity, unspecified: Secondary | ICD-10-CM

## 2019-03-11 DIAGNOSIS — Z113 Encounter for screening for infections with a predominantly sexual mode of transmission: Secondary | ICD-10-CM

## 2019-03-11 DIAGNOSIS — Z0000 Encounter for general adult medical examination without abnormal findings: Secondary | ICD-10-CM

## 2019-03-11 DIAGNOSIS — Z6830 Body mass index (BMI) 30.0-30.9, adult: Secondary | ICD-10-CM | POA: Diagnosis not present

## 2019-03-11 DIAGNOSIS — E559 Vitamin D deficiency, unspecified: Secondary | ICD-10-CM

## 2019-03-11 LAB — POCT URINALYSIS DIPSTICK
Bilirubin, UA: NEGATIVE
Blood, UA: NEGATIVE
Glucose, UA: NEGATIVE
Ketones, UA: NEGATIVE
Leukocytes, UA: NEGATIVE
Nitrite, UA: NEGATIVE
Protein, UA: NEGATIVE
Spec Grav, UA: 1.025 (ref 1.010–1.025)
Urobilinogen, UA: 0.2 E.U./dL
pH, UA: 5 (ref 5.0–8.0)

## 2019-03-11 NOTE — Patient Instructions (Signed)
Health Maintenance, Female Adopting a healthy lifestyle and getting preventive care are important in promoting health and wellness. Ask your health care provider about:  The right schedule for you to have regular tests and exams.  Things you can do on your own to prevent diseases and keep yourself healthy. What should I know about diet, weight, and exercise? Eat a healthy diet   Eat a diet that includes plenty of vegetables, fruits, low-fat dairy products, and lean protein.  Do not eat a lot of foods that are high in solid fats, added sugars, or sodium. Maintain a healthy weight Body mass index (BMI) is used to identify weight problems. It estimates body fat based on height and weight. Your health care provider can help determine your BMI and help you achieve or maintain a healthy weight. Get regular exercise Get regular exercise. This is one of the most important things you can do for your health. Most adults should:  Exercise for at least 150 minutes each week. The exercise should increase your heart rate and make you sweat (moderate-intensity exercise).  Do strengthening exercises at least twice a week. This is in addition to the moderate-intensity exercise.  Spend less time sitting. Even light physical activity can be beneficial. Watch cholesterol and blood lipids Have your blood tested for lipids and cholesterol at 55 years of age, then have this test every 5 years. Have your cholesterol levels checked more often if:  Your lipid or cholesterol levels are high.  You are older than 55 years of age.  You are at high risk for heart disease. What should I know about cancer screening? Depending on your health history and family history, you may need to have cancer screening at various ages. This may include screening for:  Breast cancer.  Cervical cancer.  Colorectal cancer.  Skin cancer.  Lung cancer. What should I know about heart disease, diabetes, and high blood  pressure? Blood pressure and heart disease  High blood pressure causes heart disease and increases the risk of stroke. This is more likely to develop in people who have high blood pressure readings, are of African descent, or are overweight.  Have your blood pressure checked: ? Every 3-5 years if you are 18-39 years of age. ? Every year if you are 40 years old or older. Diabetes Have regular diabetes screenings. This checks your fasting blood sugar level. Have the screening done:  Once every three years after age 40 if you are at a normal weight and have a low risk for diabetes.  More often and at a younger age if you are overweight or have a high risk for diabetes. What should I know about preventing infection? Hepatitis B If you have a higher risk for hepatitis B, you should be screened for this virus. Talk with your health care provider to find out if you are at risk for hepatitis B infection. Hepatitis C Testing is recommended for:  Everyone born from 1945 through 1965.  Anyone with known risk factors for hepatitis C. Sexually transmitted infections (STIs)  Get screened for STIs, including gonorrhea and chlamydia, if: ? You are sexually active and are younger than 55 years of age. ? You are older than 55 years of age and your health care provider tells you that you are at risk for this type of infection. ? Your sexual activity has changed since you were last screened, and you are at increased risk for chlamydia or gonorrhea. Ask your health care provider if   you are at risk.  Ask your health care provider about whether you are at high risk for HIV. Your health care provider may recommend a prescription medicine to help prevent HIV infection. If you choose to take medicine to prevent HIV, you should first get tested for HIV. You should then be tested every 3 months for as long as you are taking the medicine. Pregnancy  If you are about to stop having your period (premenopausal) and  you may become pregnant, seek counseling before you get pregnant.  Take 400 to 800 micrograms (mcg) of folic acid every day if you become pregnant.  Ask for birth control (contraception) if you want to prevent pregnancy. Osteoporosis and menopause Osteoporosis is a disease in which the bones lose minerals and strength with aging. This can result in bone fractures. If you are 65 years old or older, or if you are at risk for osteoporosis and fractures, ask your health care provider if you should:  Be screened for bone loss.  Take a calcium or vitamin D supplement to lower your risk of fractures.  Be given hormone replacement therapy (HRT) to treat symptoms of menopause. Follow these instructions at home: Lifestyle  Do not use any products that contain nicotine or tobacco, such as cigarettes, e-cigarettes, and chewing tobacco. If you need help quitting, ask your health care provider.  Do not use street drugs.  Do not share needles.  Ask your health care provider for help if you need support or information about quitting drugs. Alcohol use  Do not drink alcohol if: ? Your health care provider tells you not to drink. ? You are pregnant, may be pregnant, or are planning to become pregnant.  If you drink alcohol: ? Limit how much you use to 0-1 drink a day. ? Limit intake if you are breastfeeding.  Be aware of how much alcohol is in your drink. In the [place] one drink equals one 12 oz bottle of beer (355 mL), one 5 oz glass of wine (148 mL), or one 1 oz glass of hard liquor (44 mL). General instructions  Schedule regular health, dental, and eye exams.  Stay current with your vaccines.  Tell your health care provider if: ? You often feel depressed. ? You have ever been abused or do not feel safe at home. Summary  Adopting a healthy lifestyle and getting preventive care are important in promoting health and wellness.  Follow your health care provider's instructions about healthy  diet, exercising, and getting tested or screened for diseases.  Follow your health care provider's instructions on monitoring your cholesterol and blood pressure. This information is not intended to replace advice given to you by your health care provider. Make sure you discuss any questions you have with your health care provider. Document Revised: 02/07/2018 Document Reviewed: 02/07/2018 Elsevier Patient Education  2020 [employer] Inc.  

## 2019-03-11 NOTE — Progress Notes (Signed)
Subjective:     Patient ID: Kristy [name] , female    DOB: 07/30/1965 , 54 y.o.   MRN: [id number]   Chief Complaint  Patient presents with  . Annual Exam   The patient states she is status post hysterectomy for birth control.  Negative for Dysmenorrhea and Negative for Menorrhagia Mammogram last done November 30, 2018.   Negative for: breast discharge, breast lump(s), breast pain and breast self exam.  Pertinent negatives include abnormal bleeding (hematology), anxiety, decreased libido, depression, difficulty falling sleep, dyspareunia, history of infertility, nocturia, sexual dysfunction, sleep disturbances, urinary incontinence, urinary urgency, vaginal discharge and vaginal itching. Diet regular, tries to avoid high starches, more vegetables and more meats.  The patient states her exercise level is minimal, she is planning to start class.   The patient's tobacco use is:   Social History   Tobacco Use  Smoking Status Never Smoker  Smokeless Tobacco Never Used   She has been exposed to passive smoke. The patient's alcohol use is:  Social History   Substance and Sexual Activity  Alcohol Use No   Additional information: Last pap hysterectomy.    HPI   Here for HM  Wt Readings from Last 3 Encounters: 03/11/19 : 197 lb 6.4 oz (89.5 kg) 09/20/18 : 198 lb 9.6 oz (90.1 kg) 03/07/18 : 199 lb 12.8 oz (90.6 kg)   She is having more regular HSV outbreaks, she is taking chaga - and she is doubling up.  Without full HSV.      Past Medical History:  Diagnosis Date  . Abdominal pain, periumbilical   . Acute constipation   . Anemia   . Clotting disorder ([place])   . Pulmonary embolism ([place]) 2009     Family History  Problem Relation Age of Onset  . Cancer Mother   . Cancer Father      Current Outpatient Medications:  .  triamcinolone (NASACORT) 55 MCG/ACT AERO nasal inhaler, Place 2 sprays into the nose daily. (Patient taking differently: Place 2 sprays into the nose as  needed. ), Disp: 1 Inhaler, Rfl: 0 .  Vitamin D, Ergocalciferol, (DRISDOL) 1.25 MG (50000 UT) CAPS capsule, TAKE 1 CAPSULE (50,000 UNITS TOTAL) BY MOUTH 2 (TWO) TIMES A WEEK., Disp: 24 capsule, Rfl: 0   No Known Allergies   Review of Systems  Constitutional: Negative.   HENT: Positive for ear pain (left ear pain when cleaning ears.  ).   Eyes: Negative.   Respiratory: Negative.   Cardiovascular: Negative.   Gastrointestinal: Negative.   Endocrine: Negative.   Genitourinary: Negative.   Musculoskeletal: Negative.   Skin: Negative.        Left side under breast with abnormal mole.   Allergic/Immunologic: Negative.   Neurological: Negative.  Negative for dizziness and headaches.  Hematological: Negative.   Psychiatric/Behavioral: Negative.      Today's Vitals   03/11/19 0852  BP: 112/70  Pulse: 65  Temp: 98.3 F (36.8 C)  TempSrc: Oral  Weight: 197 lb 6.4 oz (89.5 kg)  Height: 5' 7.6" (1.717 m)  PainSc: 0-No pain   Body mass index is 30.37 kg/m.   Objective:  Physical Exam Constitutional:      General: She is not in acute distress.    Appearance: Normal appearance. She is well-developed. She is obese.  HENT:     Head: Normocephalic and atraumatic.     Right Ear: Hearing, tympanic membrane, ear canal and external ear normal.     Left Ear: Hearing,  tympanic membrane, ear canal and external ear normal.     Nose: Nose normal.     Mouth/Throat:     Mouth: Mucous membranes are moist.  Eyes:     General: Lids are normal.     Conjunctiva/sclera: Conjunctivae normal.     Pupils: Pupils are equal, round, and reactive to light.     Funduscopic exam:    Right eye: No papilledema.        Left eye: No papilledema.  Neck:     Thyroid: No thyroid mass.     Vascular: No carotid bruit.  Cardiovascular:     Rate and Rhythm: Normal rate and regular rhythm.     Pulses: Normal pulses.     Heart sounds: Normal heart sounds. No murmur.  Pulmonary:     Effort: Pulmonary effort is  normal. No respiratory distress.     Breath sounds: Normal breath sounds. No wheezing or rhonchi.  Abdominal:     General: Bowel sounds are normal.     Palpations: Abdomen is soft.     Tenderness: There is no abdominal tenderness.  Musculoskeletal:        General: No swelling or tenderness. Normal range of motion.     Cervical back: Full passive range of motion without pain, normal range of motion and neck supple.  Skin:    General: Skin is warm and dry.     Capillary Refill: Capillary refill takes less than 2 seconds.     Findings: Rash (she has a raised nevi to her left abdomen, firm and scaly) present.  Neurological:     General: No focal deficit present.     Mental Status: She is alert and oriented to person, place, and time.     Cranial Nerves: No cranial nerve deficit.     Sensory: No sensory deficit.  Psychiatric:        Mood and Affect: Mood normal.        Behavior: Behavior normal.        Thought Content: Thought content normal.        Judgment: Judgment normal.         Assessment And Plan:     1. Encounter for general adult medical examination w/o abnormal findings Behavior modifications discussed and diet history reviewed.   Pt will continue to exercise regularly and modify diet with low GI, plant based foods and decrease intake of processed foods.  Recommend intake of daily multivitamin, Vitamin D, and calcium.  Recommend for preventive screenings, as well as recommend immunizations that include influenza, TDAP  2. Obesity (BMI 30-39.9) Chronic Discussed healthy diet and regular exercise options  Encouraged to exercise at least 150 minutes per week with 2 days of strength training - she is starting a new exercise routine with the step   3. Prediabetes Chronic, controlled Continue with current medications Encouraged to limit intake of sugary foods and drinks Encouraged to increase physical activity to 150 minutes per week  4. Vitamin D deficiency  Will check  vitamin D level and supplement as needed.     Also encouraged to spend 15 minutes in the sun daily.   5. Rash and nonspecific skin eruption  Left side abdomen with slightly calcified nevus  Will refer to dermatology for evaluation, will likely need removal due to the location and irritability - Ambulatory referral to Dermatology  6. HSV (herpes simplex virus) infection  3 outbreaks since July however not as severe she is taking [place] which she  feels is beneficial letter given for [facility]  7. Screen for STD (sexually transmitted disease)  Will check HIV unable to locate from last year - HIV antibody (with reflex)    [name], FNP

## 2019-03-12 LAB — CMP14+EGFR
ALT: 8 IU/L (ref 0–32)
AST: 12 IU/L (ref 0–40)
Albumin/Globulin Ratio: 1.5 (ref 1.2–2.2)
Albumin: 3.8 g/dL (ref 3.8–4.9)
Alkaline Phosphatase: 87 IU/L (ref 39–117)
BUN/Creatinine Ratio: 9 (ref 9–23)
BUN: 8 mg/dL (ref 6–24)
Bilirubin Total: 0.3 mg/dL (ref 0.0–1.2)
CO2: 27 mmol/L (ref 20–29)
Calcium: 9 mg/dL (ref 8.7–10.2)
Chloride: 104 mmol/L (ref 96–106)
Creatinine, Ser: 0.93 mg/dL (ref 0.57–1.00)
GFR calc Af Amer: 81 mL/min/{1.73_m2} (ref >59)
GFR calc non Af Amer: 70 mL/min/{1.73_m2} (ref >59)
Globulin, Total: 2.6 g/dL (ref 1.5–4.5)
Glucose: 95 mg/dL (ref 65–99)
Potassium: 3.9 mmol/L (ref 3.5–5.2)
Sodium: 140 mmol/L (ref 134–144)
Total Protein: 6.4 g/dL (ref 6.0–8.5)

## 2019-03-12 LAB — VITAMIN D 25 HYDROXY (VIT D DEFICIENCY, FRACTURES): Vit D, 25-Hydroxy: 67.9 ng/mL (ref 30.0–100.0)

## 2019-03-12 LAB — CBC
Hematocrit: 35.3 % (ref 34.0–46.6)
Hemoglobin: 11.2 g/dL (ref 11.1–15.9)
MCH: 22.3 pg — ABNORMAL LOW (ref 26.6–33.0)
MCHC: 31.7 g/dL (ref 31.5–35.7)
MCV: 70 fL — ABNORMAL LOW (ref 79–97)
Platelets: 231 10*3/uL (ref 150–450)
RBC: 5.03 x10E6/uL (ref 3.77–5.28)
RDW: 15.5 % — ABNORMAL HIGH (ref 11.7–15.4)
WBC: 5.6 10*3/uL (ref 3.4–10.8)

## 2019-03-12 LAB — LIPID PANEL
Chol/HDL Ratio: 2.9 ratio (ref 0.0–4.4)
Cholesterol, Total: 189 mg/dL (ref 100–199)
HDL: 65 mg/dL (ref >39)
LDL Chol Calc (NIH): 111 mg/dL — ABNORMAL HIGH (ref 0–99)
Triglycerides: 71 mg/dL (ref 0–149)
VLDL Cholesterol Cal: 13 mg/dL (ref 5–40)

## 2019-03-12 LAB — HIV ANTIBODY (ROUTINE TESTING W REFLEX): HIV Screen 4th Generation wRfx: NONREACTIVE

## 2019-05-26 ENCOUNTER — Other Ambulatory Visit: Payer: Self-pay | Admitting: Nurse Practitioner

## 2019-05-26 DIAGNOSIS — E559 Vitamin D deficiency, unspecified: Secondary | ICD-10-CM

## 2019-09-09 ENCOUNTER — Other Ambulatory Visit: Payer: Self-pay

## 2019-09-09 ENCOUNTER — Encounter: Payer: Self-pay | Admitting: Nurse Practitioner

## 2019-09-09 ENCOUNTER — Ambulatory Visit: Payer: 59 | Admitting: Nurse Practitioner

## 2019-09-09 VITALS — BP 118/70 | HR 67 | Temp 98.4°F | Ht 68.2 in | Wt 205.0 lb

## 2019-09-09 DIAGNOSIS — E6609 Other obesity due to excess calories: Secondary | ICD-10-CM | POA: Diagnosis not present

## 2019-09-09 DIAGNOSIS — H9202 Otalgia, left ear: Secondary | ICD-10-CM

## 2019-09-09 DIAGNOSIS — E559 Vitamin D deficiency, unspecified: Secondary | ICD-10-CM

## 2019-09-09 DIAGNOSIS — R7303 Prediabetes: Secondary | ICD-10-CM | POA: Diagnosis not present

## 2019-09-09 DIAGNOSIS — Z1159 Encounter for screening for other viral diseases: Secondary | ICD-10-CM

## 2019-09-09 DIAGNOSIS — Z6830 Body mass index (BMI) 30.0-30.9, adult: Secondary | ICD-10-CM

## 2019-09-09 NOTE — Patient Instructions (Addendum)
Earache, Adult An earache, or ear pain, can be caused by many things, including:  An infection.  Ear wax buildup.  Ear pressure.  Something in the ear that should not be there (foreign body).  A sore throat.  Tooth problems.  Jaw problems. Treatment of the earache will depend on the cause. If the cause is not clear or cannot be determined, you may need to watch your symptoms until your earache goes away or until a cause is found. Follow these instructions at home: Medicines  Take or apply over-the-counter and prescription medicines only as told by your health care provider.  If you were prescribed an antibiotic medicine, use it as told by your health care provider. Do not stop using the antibiotic even if you start to feel better.  Do not put anything in your ear other than medicine that is prescribed by your health care provider. Managing pain If directed, apply heat to the affected area as often as told by your health care provider. Use the heat source that your health care provider recommends, such as a moist heat pack or a heating pad.  Place a towel between your skin and the heat source.  Leave the heat on for 20-30 minutes.  Remove the heat if your skin turns bright red. This is especially important if you are unable to feel pain, heat, or cold. You may have a greater risk of getting burned. If directed, put ice on the affected area as often as told by your health care provider. To do this:      Put ice in a plastic bag.  Place a towel between your skin and the bag.  Leave the ice on for 20 minutes, 2-3 times a day. General instructions  Pay attention to any changes in your symptoms.  Try resting in an upright position instead of lying down. This may help to reduce pressure in your ear and relieve pain.  Chew gum if it helps to relieve your ear pain.  Treat any allergies as told by your health care provider.  Drink enough fluid to keep your urine pale  yellow.  It is up to you to get the results of any tests that were done. Ask your health care provider, or the department that is doing the tests, when your results will be ready.  Keep all follow-up visits as told by your health care provider. This is important. Contact a health care provider if:  Your pain does not improve within 2 days.  Your earache gets worse.  You have new symptoms.  You have a fever. Get help right away if you:  Have a severe headache.  Have a stiff neck.  Have trouble swallowing.  Have redness or swelling behind your ear.  Have fluid or blood coming from your ear.  Have hearing loss.  Feel dizzy. Summary  An earache, or ear pain, can be caused by many things.  Treatment of the earache will depend on the cause. Follow recommendations from your health care provider to treat your ear pain.  If the cause is not clear or cannot be determined, you may need to watch your symptoms until your earache goes away or until a cause is found.  Keep all follow-up visits as told by your health care provider. This is important. This information is not intended to replace advice given to you by your health care provider. Make sure you discuss any questions you have with your health care provider. Document Revised: 09/22/2018   Document Reviewed: 09/22/2018 Elsevier Patient Education  [employer].   Exercising to Lose Weight Exercise is structured, repetitive physical activity to improve fitness and health. Getting regular exercise is important for everyone. It is especially important if you are overweight. Being overweight increases your risk of heart disease, stroke, diabetes, high blood pressure, and several types of cancer. Reducing your calorie intake and exercising can help you lose weight. Exercise is usually categorized as moderate or vigorous intensity. To lose weight, most people need to do a certain amount of moderate-intensity or vigorous-intensity  exercise each week. Moderate-intensity exercise  Moderate-intensity exercise is any activity that gets you moving enough to burn at least three times more energy (calories) than if you were sitting. Examples of moderate exercise include:  Walking a mile in 15 minutes.  Doing light yard work.  Biking at an easy pace. Most people should get at least 150 minutes (2 hours and 30 minutes) a week of moderate-intensity exercise to maintain their body weight. Vigorous-intensity exercise Vigorous-intensity exercise is any activity that gets you moving enough to burn at least six times more calories than if you were sitting. When you exercise at this intensity, you should be working hard enough that you are not able to carry on a conversation. Examples of vigorous exercise include:  Running.  Playing a team sport, such as football, basketball, and soccer.  Jumping rope. Most people should get at least 75 minutes (1 hour and 15 minutes) a week of vigorous-intensity exercise to maintain their body weight. How can exercise affect me? When you exercise enough to burn more calories than you eat, you lose weight. Exercise also reduces body fat and builds muscle. The more muscle you have, the more calories you burn. Exercise also:  Improves mood.  Reduces stress and tension.  Improves your overall fitness, flexibility, and endurance.  Increases bone strength. The amount of exercise you need to lose weight depends on:  Your age.  The type of exercise.  Any health conditions you have.  Your overall physical ability. Talk to your health care provider about how much exercise you need and what types of activities are safe for you. What actions can I take to lose weight? Nutrition   Make changes to your diet as told by your health care provider or diet and nutrition specialist (dietitian). This may include: ? Eating fewer calories. ? Eating more protein. ? Eating less unhealthy  fats. ? Eating a diet that includes fresh fruits and vegetables, whole grains, low-fat dairy products, and lean protein. ? Avoiding foods with added fat, salt, and sugar.  Drink plenty of water while you exercise to prevent dehydration or heat stroke. Activity  Choose an activity that you enjoy and set realistic goals. Your health care provider can help you make an exercise plan that works for you.  Exercise at a moderate or vigorous intensity most days of the week. ? The intensity of exercise may vary from person to person. You can tell how intense a workout is for you by paying attention to your breathing and heartbeat. Most people will notice their breathing and heartbeat get faster with more intense exercise.  Do resistance training twice each week, such as: ? Push-ups. ? Sit-ups. ? Lifting weights. ? Using resistance bands.  Getting short amounts of exercise can be just as helpful as long structured periods of exercise. If you have trouble finding time to exercise, try to include exercise in your daily routine. ? Get up, stretch,  and walk around every 30 minutes throughout the day. ? Go for a walk during your lunch break. ? Park your car farther away from your destination. ? If you take public transportation, get off one stop early and walk the rest of the way. ? Make phone calls while standing up and walking around. ? Take the stairs instead of elevators or escalators.  Wear comfortable clothes and shoes with good support.  Do not exercise so much that you hurt yourself, feel dizzy, or get very short of breath. Where to find more information  [place].S. Department of Health and Human Services: [employer]  Centers for Disease Control and Prevention (CDC): http://www.wolf.info/ Contact a health care provider:  Before starting a new exercise program.  If you have questions or concerns about your weight.  If you have a medical problem that keeps you from exercising. Get help right away if  you have any of the following while exercising:  Injury.  Dizziness.  Difficulty breathing or shortness of breath that does not go away when you stop exercising.  Chest pain.  Rapid heartbeat. Summary  Being overweight increases your risk of heart disease, stroke, diabetes, high blood pressure, and several types of cancer.  Losing weight happens when you burn more calories than you eat.  Reducing the amount of calories you eat in addition to getting regular moderate or vigorous exercise each week helps you lose weight. This information is not intended to replace advice given to you by your health care provider. Make sure you discuss any questions you have with your health care provider. Document Revised: 02/27/2017 Document Reviewed: 02/27/2017 Elsevier Patient Education  [employer].   If symptoms worsen call to office may need referral to ENT

## 2019-09-09 NOTE — Progress Notes (Signed)
Rutherford Nail as a scribe for [name], FNP.,have documented all relevant documentation on the behalf of [name], FNP,as directed by  [name], FNP while in the presence of [name], [place].  This visit occurred during the SARS-CoV-2 public health emergency.  Safety protocols were in place, including screening questions prior to the visit, additional usage of staff PPE, and extensive cleaning of exam room while observing appropriate contact time as indicated for disinfecting solutions.  Subjective:     Patient ID: Kristy [name] , female    DOB: [birth year] , 54 y.o.   MRN: [id number]   Chief Complaint  Patient presents with  . Prediabetes  . vitamin d    HPI  Otalgia  There is pain in the left ear. This is a new problem. The current episode started more than 1 month ago. Episode frequency: intermittently. The problem has been unchanged. There has been no fever. The pain is at a severity of 2/10. The pain is mild. Pertinent negatives include no coughing, ear discharge or headaches. She has tried nothing for the symptoms.     Past Medical History:  Diagnosis Date  . Abdominal pain, periumbilical   . Acute constipation   . Anemia   . Clotting disorder ([place])   . Pulmonary embolism ([place]) 2009     Family History  Problem Relation Age of Onset  . Cancer Mother   . Cancer Father      Current Outpatient Medications:  .  triamcinolone (NASACORT) 55 MCG/ACT AERO nasal inhaler, Place 2 sprays into the nose daily. (Patient taking differently: Place 2 sprays into the nose as needed. ), Disp: 1 Inhaler, Rfl: 0 .  Vitamin D, Ergocalciferol, (DRISDOL) 1.25 MG (50000 UNIT) CAPS capsule, TAKE 1 CAPSULE (50,000 UNITS TOTAL) BY MOUTH 2 (TWO) TIMES A WEEK., Disp: 24 capsule, Rfl: 0   No Known Allergies   Review of Systems  Constitutional: Negative.   HENT: Positive for ear pain (left ear). Negative for ear discharge.   Respiratory: Negative.  Negative for cough.    Cardiovascular: Negative.   Endocrine: Negative for polydipsia, polyphagia and polyuria.  Neurological: Negative for headaches.  Psychiatric/Behavioral: Negative.      Today's Vitals   09/09/19 0901  BP: 118/70  Pulse: 67  Temp: 98.4 F (36.9 C)  TempSrc: Oral  Weight: 205 lb (93 kg)  Height: 5' 8.2" (1.732 m)   Body mass index is 30.99 kg/m.   Objective:  Physical Exam Vitals reviewed.  Constitutional:      General: She is not in acute distress.    Appearance: Normal appearance. She is obese.  HENT:     Right Ear: Tympanic membrane, ear canal and external ear normal. There is no impacted cerumen.     Left Ear: Tympanic membrane and external ear normal. There is no impacted cerumen.     Ears:     Comments: Left ear canal is slightly more narrow than right Cardiovascular:     Rate and Rhythm: Normal rate and regular rhythm.     Pulses: Normal pulses.     Heart sounds: Normal heart sounds. No murmur heard.   Pulmonary:     Effort: Pulmonary effort is normal. No respiratory distress.  Musculoskeletal:     Cervical back: Normal range of motion and neck supple. No tenderness.  Neurological:     General: No focal deficit present.     Mental Status: She is alert and oriented to person, place, and time.  Psychiatric:        Mood and Affect: Mood normal.        Behavior: Behavior normal.        Thought Content: Thought content normal.        Judgment: Judgment normal.         Assessment And Plan:     1. Prediabetes  Chronic, stable  No current medications - Hemoglobin A1c  2. Vitamin D deficiency  Chronic, was optimal at 16 at last visit  Will check vitamin d levels today and may continue at least once a week to help with immune system particularly with Covid since she is not interested in getting the Covid vaccine  Discussed the risk of covid with the worsening delta variant.  - VITAMIN D 25 Hydroxy (Vit-D Deficiency, Fractures)  3. Left ear pain  No  cerumen present to canal however has mild narrowing to the canal  Advised to use a nasal spray to see if helps with inflammation  If not better or difficulty with hearing may need to refer to ENT for further evaluation  4. Class 1 obesity due to excess calories without serious comorbidity with body mass index (BMI) of 30.0 to 30.9 in adult  Discussed risk of elevated BMI, goal to be less than 25 Goal to exercise 150 minutes per week with at least 2 days of strength training Encouraged to park further when at the store, take stairs instead of elevators and to walk in place during commercials. Increase water intake to at least one gallon of water daily.  5. Encounter for hepatitis C screening test for low risk patient  Will check Hepatitis C screening due to recent recommendations to screen all adults 18 years and older - Hepatitis C antibody   [name], FNP   I, [name], FNP, have reviewed all documentation for this visit. The documentation on 09/09/19 for the exam, diagnosis, procedures, and orders are all accurate and complete.   THE PATIENT IS ENCOURAGED TO PRACTICE SOCIAL DISTANCING DUE TO THE COVID-19 PANDEMIC.

## 2019-09-10 LAB — HEMOGLOBIN A1C
Est. average glucose Bld gHb Est-mCnc: 137 mg/dL
Hgb A1c MFr Bld: 6.4 % — ABNORMAL HIGH (ref 4.8–5.6)

## 2019-09-10 LAB — VITAMIN D 25 HYDROXY (VIT D DEFICIENCY, FRACTURES): Vit D, 25-Hydroxy: 81 ng/mL (ref 30.0–100.0)

## 2019-09-10 LAB — HEPATITIS C ANTIBODY: Hep C Virus Ab: <0.1 s/co ratio (ref 0.0–0.9)

## 2019-12-02 LAB — HM MAMMOGRAPHY

## 2019-12-03 ENCOUNTER — Encounter: Payer: Self-pay | Admitting: Nurse Practitioner

## 2020-03-16 ENCOUNTER — Encounter: Payer: 59 | Admitting: Nurse Practitioner

## 2020-03-20 ENCOUNTER — Encounter: Payer: 59 | Admitting: Nurse Practitioner

## 2020-04-02 ENCOUNTER — Encounter: Payer: 59 | Admitting: Nurse Practitioner

## 2020-04-09 ENCOUNTER — Encounter: Payer: 59 | Admitting: Nurse Practitioner

## 2020-04-23 ENCOUNTER — Other Ambulatory Visit (HOSPITAL_COMMUNITY)
Admission: RE | Admit: 2020-04-23 | Discharge: 2020-04-23 | Disposition: A | Discharge status: Home / Self Care | Payer: 59 | Source: Ambulatory Visit | Attending: Nurse Practitioner | Admitting: Nurse Practitioner

## 2020-04-23 ENCOUNTER — Ambulatory Visit (INDEPENDENT_AMBULATORY_CARE_PROVIDER_SITE_OTHER): Payer: 59 | Admitting: Nurse Practitioner

## 2020-04-23 ENCOUNTER — Other Ambulatory Visit: Payer: Self-pay

## 2020-04-23 ENCOUNTER — Encounter: Payer: Self-pay | Admitting: Nurse Practitioner

## 2020-04-23 VITALS — BP 122/78 | HR 65 | Temp 97.8°F | Ht 68.6 in | Wt 196.8 lb

## 2020-04-23 DIAGNOSIS — Z0000 Encounter for general adult medical examination without abnormal findings: Secondary | ICD-10-CM | POA: Diagnosis not present

## 2020-04-23 DIAGNOSIS — E663 Overweight: Secondary | ICD-10-CM | POA: Diagnosis not present

## 2020-04-23 DIAGNOSIS — N898 Other specified noninflammatory disorders of vagina: Secondary | ICD-10-CM

## 2020-04-23 DIAGNOSIS — R7303 Prediabetes: Secondary | ICD-10-CM

## 2020-04-23 DIAGNOSIS — Z01419 Encounter for gynecological examination (general) (routine) without abnormal findings: Secondary | ICD-10-CM | POA: Diagnosis not present

## 2020-04-23 DIAGNOSIS — Z6829 Body mass index (BMI) 29.0-29.9, adult: Secondary | ICD-10-CM

## 2020-04-23 DIAGNOSIS — E559 Vitamin D deficiency, unspecified: Secondary | ICD-10-CM

## 2020-04-23 DIAGNOSIS — H9202 Otalgia, left ear: Secondary | ICD-10-CM | POA: Diagnosis not present

## 2020-04-23 DIAGNOSIS — Z2821 Immunization not carried out because of patient refusal: Secondary | ICD-10-CM

## 2020-04-23 NOTE — Patient Instructions (Signed)
Health Maintenance, Female Adopting a healthy lifestyle and getting preventive care are important in promoting health and wellness. Ask your health care provider about:  The right schedule for you to have regular tests and exams.  Things you can do on your own to prevent diseases and keep yourself healthy. What should I know about diet, weight, and exercise? Eat a healthy diet  Eat a diet that includes plenty of vegetables, fruits, low-fat dairy products, and lean protein.  Do not eat a lot of foods that are high in solid fats, added sugars, or sodium.   Maintain a healthy weight Body mass index (BMI) is used to identify weight problems. It estimates body fat based on height and weight. Your health care provider can help determine your BMI and help you achieve or maintain a healthy weight. Get regular exercise Get regular exercise. This is one of the most important things you can do for your health. Most adults should:  Exercise for at least 150 minutes each week. The exercise should increase your heart rate and make you sweat (moderate-intensity exercise).  Do strengthening exercises at least twice a week. This is in addition to the moderate-intensity exercise.  Spend less time sitting. Even light physical activity can be beneficial. Watch cholesterol and blood lipids Have your blood tested for lipids and cholesterol at 56 years of age, then have this test every 5 years. Have your cholesterol levels checked more often if:  Your lipid or cholesterol levels are high.  You are older than 56 years of age.  You are at high risk for heart disease. What should I know about cancer screening? Depending on your health history and family history, you may need to have cancer screening at various ages. This may include screening for:  Breast cancer.  Cervical cancer.  Colorectal cancer.  Skin cancer.  Lung cancer. What should I know about heart disease, diabetes, and high blood  pressure? Blood pressure and heart disease  High blood pressure causes heart disease and increases the risk of stroke. This is more likely to develop in people who have high blood pressure readings, are of African descent, or are overweight.  Have your blood pressure checked: ? Every 3-5 years if you are 18-39 years of age. ? Every year if you are 40 years old or older. Diabetes Have regular diabetes screenings. This checks your fasting blood sugar level. Have the screening done:  Once every three years after age 40 if you are at a normal weight and have a low risk for diabetes.  More often and at a younger age if you are overweight or have a high risk for diabetes. What should I know about preventing infection? Hepatitis B If you have a higher risk for hepatitis B, you should be screened for this virus. Talk with your health care provider to find out if you are at risk for hepatitis B infection. Hepatitis C Testing is recommended for:  Everyone born from 1945 through 1965.  Anyone with known risk factors for hepatitis C. Sexually transmitted infections (STIs)  Get screened for STIs, including gonorrhea and chlamydia, if: ? You are sexually active and are younger than 56 years of age. ? You are older than 56 years of age and your health care provider tells you that you are at risk for this type of infection. ? Your sexual activity has changed since you were last screened, and you are at increased risk for chlamydia or gonorrhea. Ask your health care provider   if you are at risk.  Ask your health care provider about whether you are at high risk for HIV. Your health care provider may recommend a prescription medicine to help prevent HIV infection. If you choose to take medicine to prevent HIV, you should first get tested for HIV. You should then be tested every 3 months for as long as you are taking the medicine. Pregnancy  If you are about to stop having your period (premenopausal) and  you may become pregnant, seek counseling before you get pregnant.  Take 400 to 800 micrograms (mcg) of folic acid every day if you become pregnant.  Ask for birth control (contraception) if you want to prevent pregnancy. Osteoporosis and menopause Osteoporosis is a disease in which the bones lose minerals and strength with aging. This can result in bone fractures. If you are 65 years old or older, or if you are at risk for osteoporosis and fractures, ask your health care provider if you should:  Be screened for bone loss.  Take a calcium or vitamin D supplement to lower your risk of fractures.  Be given hormone replacement therapy (HRT) to treat symptoms of menopause. Follow these instructions at home: Lifestyle  Do not use any products that contain nicotine or tobacco, such as cigarettes, e-cigarettes, and chewing tobacco. If you need help quitting, ask your health care provider.  Do not use street drugs.  Do not share needles.  Ask your health care provider for help if you need support or information about quitting drugs. Alcohol use  Do not drink alcohol if: ? Your health care provider tells you not to drink. ? You are pregnant, may be pregnant, or are planning to become pregnant.  If you drink alcohol: ? Limit how much you use to 0-1 drink a day. ? Limit intake if you are breastfeeding.  Be aware of how much alcohol is in your drink. In the [place] one drink equals one 12 oz bottle of beer (355 mL), one 5 oz glass of wine (148 mL), or one 1 oz glass of hard liquor (44 mL). General instructions  Schedule regular health, dental, and eye exams.  Stay current with your vaccines.  Tell your health care provider if: ? You often feel depressed. ? You have ever been abused or do not feel safe at home. Summary  Adopting a healthy lifestyle and getting preventive care are important in promoting health and wellness.  Follow your health care provider's instructions about healthy  diet, exercising, and getting tested or screened for diseases.  Follow your health care provider's instructions on monitoring your cholesterol and blood pressure. This information is not intended to replace advice given to you by your health care provider. Make sure you discuss any questions you have with your health care provider. Document Revised: 02/07/2018 Document Reviewed: 02/07/2018 Elsevier Patient Education  2021 [employer] Inc.  

## 2020-04-23 NOTE — Progress Notes (Signed)
I,Tianna Badgett,acting as a Education administrator for [employer], NP.,have documented all relevant documentation on the behalf of [employer], NP,as directed by  Bary Castilla, NP while in the presence of Bary Castilla, NP.  This visit occurred during the SARS-CoV-2 public health emergency.  Safety protocols were in place, including screening questions prior to the visit, additional usage of staff PPE, and extensive cleaning of exam room while observing appropriate contact time as indicated for disinfecting solutions.  Subjective:     Patient ID: Kristy [name] , female    DOB: [birth year] , 55 y.o.   MRN: [id number]   Chief Complaint  Patient presents with  . Annual Exam    HPI  Patient is here for full physical exam. Her last PAP smear was in 2018.   Wt Readings from Last 3 Encounters: 04/23/20 : 196 lb 12.8 oz (89.3 kg) 09/09/19 : 205 lb (93 kg) 03/11/19 : 197 lb 6.4 oz (89.5 kg)  Diet: She tries to cut back on carbs, vegetables and meats.  Exercise: she walks and gets about 2.5 miles per day. Work outs atleast 2 times a week.  Does not smoke or drink.     Past Medical History:  Diagnosis Date  . Abdominal pain, periumbilical   . Acute constipation   . Anemia   . Clotting disorder ([place])   . Pulmonary embolism ([place]) 2009     Family History  Problem Relation Age of Onset  . Cancer Mother   . Cancer Father      Current Outpatient Medications:  .  triamcinolone (NASACORT) 55 MCG/ACT AERO nasal inhaler, Place 2 sprays into the nose daily. (Patient taking differently: Place 2 sprays into the nose as needed. ), Disp: 1 Inhaler, Rfl: 0 .  Vitamin D, Ergocalciferol, (DRISDOL) 1.25 MG (50000 UNIT) CAPS capsule, TAKE 1 CAPSULE (50,000 UNITS TOTAL) BY MOUTH 2 (TWO) TIMES A WEEK., Disp: 24 capsule, Rfl: 0   No Known Allergies   Patient has had partial hysterectomy.  Negative for: breast discharge, breast lump(s), breast pain and breast self exam. Associated symptoms  include abnormal vaginal bleeding. Pertinent negatives include abnormal bleeding (hematology), anxiety, decreased libido, depression, difficulty falling sleep, dyspareunia, history of infertility, nocturia, sexual dysfunction, sleep disturbances, urinary incontinence, urinary urgency, vaginal discharge and vaginal itching. Diet regular.The patient states her exercise level is    . The patient's tobacco use is:  Social History   Tobacco Use  Smoking Status Never Smoker  Smokeless Tobacco Never Used  . She has been exposed to passive smoke. The patient's alcohol use is:  Social History   Substance and Sexual Activity  Alcohol Use No  . Additional information: Last pap 2018 next one scheduled for today   Review of Systems  Constitutional: Negative.  Negative for fatigue.  HENT: Positive for ear pain. Negative for sinus pressure and sinus pain.   Eyes: Negative.  Negative for pain.  Respiratory: Negative.  Negative for chest tightness, shortness of breath and wheezing.   Cardiovascular: Negative.  Negative for chest pain and palpitations.  Gastrointestinal: Negative.  Negative for abdominal pain, constipation and diarrhea.  Endocrine: Negative.  Negative for cold intolerance and heat intolerance.  Genitourinary: Negative.   Musculoskeletal: Negative.  Negative for myalgias.  Skin: Negative.   Allergic/Immunologic: Negative.   Neurological: Negative.  Negative for light-headedness, numbness and headaches.  Hematological: Negative.   Psychiatric/Behavioral: Negative.      Today's Vitals   04/23/20 1002  BP: 122/78  Pulse: 65  Temp:  97.8 F (36.6 C)  TempSrc: Oral  Weight: 196 lb 12.8 oz (89.3 kg)  Height: 5' 8.6" (1.742 m)   Body mass index is 29.4 kg/m.  Wt Readings from Last 3 Encounters:  04/23/20 196 lb 12.8 oz (89.3 kg)  09/09/19 205 lb (93 kg)  03/11/19 197 lb 6.4 oz (89.5 kg)    Objective:  Physical Exam Exam conducted with a chaperone present.  Constitutional:       General: She is not in acute distress.    Appearance: Normal appearance. She is well-developed. She is obese.  HENT:     Head: Normocephalic and atraumatic.     Right Ear: Hearing, tympanic membrane, ear canal and external ear normal. There is no impacted cerumen.     Left Ear: Hearing, tympanic membrane, ear canal and external ear normal. There is no impacted cerumen.     Nose:     Comments: Deferred - masked    Mouth/Throat:     Comments: Deferred - masked Eyes:     General: Lids are normal.     Extraocular Movements: Extraocular movements intact.     Conjunctiva/sclera: Conjunctivae normal.     Pupils: Pupils are equal, round, and reactive to light.     Funduscopic exam:    Right eye: No papilledema.        Left eye: No papilledema.  Neck:     Thyroid: No thyroid mass.     Vascular: No carotid bruit.  Cardiovascular:     Rate and Rhythm: Normal rate and regular rhythm.     Pulses: Normal pulses.     Heart sounds: Normal heart sounds. No murmur heard.   Pulmonary:     Effort: Pulmonary effort is normal.     Breath sounds: Normal breath sounds. No wheezing.  Chest:     Chest wall: No mass.  Breasts:     Tanner Score is 5.     Right: Normal. No mass, tenderness, axillary adenopathy or supraclavicular adenopathy.     Left: Normal. No mass, tenderness, axillary adenopathy or supraclavicular adenopathy.    Abdominal:     General: Bowel sounds are normal. There is no distension.     Palpations: Abdomen is soft.     Tenderness: There is no abdominal tenderness.  Genitourinary:    Vagina: Vaginal discharge present.     Rectum: Guaiac result negative.     Comments: White, milky discharge  Musculoskeletal:        General: No swelling. Normal range of motion.     Cervical back: Full passive range of motion without pain, normal range of motion and neck supple.     Right lower leg: No edema.     Left lower leg: No edema.  Lymphadenopathy:     Upper Body:     Right upper  body: No supraclavicular, axillary or pectoral adenopathy.     Left upper body: No supraclavicular, axillary or pectoral adenopathy.  Skin:    General: Skin is warm and dry.     Capillary Refill: Capillary refill takes less than 2 seconds.  Neurological:     General: No focal deficit present.     Mental Status: She is alert and oriented to person, place, and time.     Cranial Nerves: No cranial nerve deficit.     Sensory: No sensory deficit.  Psychiatric:        Mood and Affect: Mood normal.        Behavior: Behavior normal.  Thought Content: Thought content normal.        Judgment: Judgment normal.         Assessment And Plan:     1. Encounter for general adult medical examination w/o abnormal findings -Recommend intake of daily multivitamin, Vitamin D, and calcium.  -Recommend for preventive screenings, as well as recommend immunizations that include influenza, TDAP and the COVID vaccine.  -Patient will continue to exercise and modify her diet to include more greens and vegetables to her diet.   2. Prediabetes -Chronic, stable  -No current medication  - CBC - Hemoglobin A1c - CMP14+EGFR - Lipid panel  3. Vitamin D deficiency -Will check Vit D level and supplement as needed.  - Vitamin D (25 hydroxy)  4. Left ear pain -Continue using nasal spray as needed -Recommended ENT for further evaluation. Patient declined at this time. May be consideration in the future   5. Vaginal itching - Vaginal Yeast Culture -Will treat as needed with culture results.   6. Encounter for cervical Pap smear with pelvic exam - Cytology -Pap Smear  7. COVID-19 vaccination declined -Patient declined at this time.   8. Overweight with body mass index (BMI) of 29 to 29.9 in adult -Congratulated patient on losing 9 pounds since last visit. Encouraged patient to continue exercising and modifying her diet.   Staying healthy and adopting a healthy lifestyle for your overall health is  important. You should eat 7 or more servings of fruits and vegetables per day. You should drink plenty of water to keep yourself hydrated and your kidneys healthy. This includes about 65-80+ fluid ounces of water. Limit your intake of animal fats especially for elevated cholesterol. Avoid highly processed food and limit your salt intake if you have hypertension. Avoid foods high in saturated/Trans fats. Along with a healthy diet it is also very important to maintain time for yourself to maintain a healthy mental health with low stress levels. You should get atleast 150 min of moderate intensity exercise weekly for a healthy heart. Along with eating right and exercising, aim for at least 7-9 hours of sleep daily.  Eat more whole grains which includes barley, wheat berries, oats, brown rice and whole wheat pasta. Use healthy plant oils which include olive, soy, corn, sunflower and peanut. Limit your caffeine and sugary drinks. Limit your intake of fast foods. Limit milk and dairy products to one or two daily servings.    Patient was given opportunity to ask questions. Patient verbalized understanding of the plan and was able to repeat key elements of the plan. All questions were answered to their satisfaction.   Bary Castilla, NP   I, Bary Castilla, NP, have reviewed all documentation for this visit. The documentation on 04/23/20 for the exam, diagnosis, procedures, and orders are all accurate and complete.  THE PATIENT IS ENCOURAGED TO PRACTICE SOCIAL DISTANCING DUE TO THE COVID-19 PANDEMIC.

## 2020-04-24 LAB — CBC
Hematocrit: 36.6 % (ref 34.0–46.6)
Hemoglobin: 11.3 g/dL (ref 11.1–15.9)
MCH: 21.9 pg — ABNORMAL LOW (ref 26.6–33.0)
MCHC: 30.9 g/dL — ABNORMAL LOW (ref 31.5–35.7)
MCV: 71 fL — ABNORMAL LOW (ref 79–97)
Platelets: 205 10*3/uL (ref 150–450)
RBC: 5.16 x10E6/uL (ref 3.77–5.28)
RDW: 17.2 % — ABNORMAL HIGH (ref 11.7–15.4)
WBC: 5.9 10*3/uL (ref 3.4–10.8)

## 2020-04-24 LAB — CMP14+EGFR
ALT: 12 IU/L (ref 0–32)
AST: 16 IU/L (ref 0–40)
Albumin/Globulin Ratio: 1.5 (ref 1.2–2.2)
Albumin: 4 g/dL (ref 3.8–4.9)
Alkaline Phosphatase: 83 IU/L (ref 44–121)
BUN/Creatinine Ratio: 11 (ref 9–23)
BUN: 11 mg/dL (ref 6–24)
Bilirubin Total: <0.2 mg/dL (ref 0.0–1.2)
CO2: 22 mmol/L (ref 20–29)
Calcium: 9 mg/dL (ref 8.7–10.2)
Chloride: 103 mmol/L (ref 96–106)
Creatinine, Ser: 0.96 mg/dL (ref 0.57–1.00)
GFR calc Af Amer: 77 mL/min/{1.73_m2} (ref >59)
GFR calc non Af Amer: 67 mL/min/{1.73_m2} (ref >59)
Globulin, Total: 2.6 g/dL (ref 1.5–4.5)
Glucose: 89 mg/dL (ref 65–99)
Potassium: 4.1 mmol/L (ref 3.5–5.2)
Sodium: 139 mmol/L (ref 134–144)
Total Protein: 6.6 g/dL (ref 6.0–8.5)

## 2020-04-24 LAB — LIPID PANEL
Chol/HDL Ratio: 2.5 ratio (ref 0.0–4.4)
Cholesterol, Total: 203 mg/dL — ABNORMAL HIGH (ref 100–199)
HDL: 80 mg/dL (ref >39)
LDL Chol Calc (NIH): 110 mg/dL — ABNORMAL HIGH (ref 0–99)
Triglycerides: 75 mg/dL (ref 0–149)
VLDL Cholesterol Cal: 13 mg/dL (ref 5–40)

## 2020-04-24 LAB — HEMOGLOBIN A1C
Est. average glucose Bld gHb Est-mCnc: 128 mg/dL
Hgb A1c MFr Bld: 6.1 % — ABNORMAL HIGH (ref 4.8–5.6)

## 2020-04-24 LAB — VITAMIN D 25 HYDROXY (VIT D DEFICIENCY, FRACTURES): Vit D, 25-Hydroxy: 49 ng/mL (ref 30.0–100.0)

## 2020-04-27 ENCOUNTER — Other Ambulatory Visit: Payer: Self-pay | Admitting: Nurse Practitioner

## 2020-04-27 DIAGNOSIS — E559 Vitamin D deficiency, unspecified: Secondary | ICD-10-CM

## 2020-04-27 LAB — CYTOLOGY - PAP: Diagnosis: NEGATIVE

## 2020-04-27 MED ORDER — VITAMIN D (ERGOCALCIFEROL) 1.25 MG (50000 UNIT) PO CAPS: 50000.0000 [IU] | ORAL_CAPSULE | ORAL | 0 refills | Status: DC

## 2020-05-04 LAB — YEAST ONLY, CULTURE

## 2020-12-07 ENCOUNTER — Encounter: Payer: Self-pay | Admitting: Nurse Practitioner

## 2020-12-17 ENCOUNTER — Encounter: Payer: Self-pay | Admitting: Nurse Practitioner

## 2021-01-04 ENCOUNTER — Encounter: Payer: Self-pay | Admitting: Nurse Practitioner

## 2021-01-04 ENCOUNTER — Other Ambulatory Visit: Payer: Self-pay

## 2021-01-04 ENCOUNTER — Ambulatory Visit: Payer: 59 | Admitting: Nurse Practitioner

## 2021-01-04 VITALS — BP 122/80 | HR 67 | Temp 98.2°F | Ht 68.6 in | Wt 206.4 lb

## 2021-01-04 DIAGNOSIS — Z6830 Body mass index (BMI) 30.0-30.9, adult: Secondary | ICD-10-CM

## 2021-01-04 DIAGNOSIS — E6609 Other obesity due to excess calories: Secondary | ICD-10-CM

## 2021-01-04 DIAGNOSIS — R2232 Localized swelling, mass and lump, left upper limb: Secondary | ICD-10-CM | POA: Diagnosis not present

## 2021-01-04 DIAGNOSIS — L299 Pruritus, unspecified: Secondary | ICD-10-CM | POA: Diagnosis not present

## 2021-01-04 DIAGNOSIS — Z2821 Immunization not carried out because of patient refusal: Secondary | ICD-10-CM

## 2021-01-04 NOTE — Progress Notes (Signed)
Kristy Kristy [name],acting as a Education administrator for Kristy Brine, FNP.,have documented all relevant documentation on the behalf of Kristy Brine, FNP,as directed by  Kristy Brine, FNP while in the presence of Kristy Kristy [name], Kristy Kristy [name].  This visit occurred during the SARS-CoV-2 public health emergency.  Safety protocols were in place, including screening questions prior to the visit, additional usage of staff PPE, and extensive cleaning of exam room while observing appropriate contact time as indicated for disinfecting solutions.  Subjective:     Patient ID: Kristy Kristy [name] , female    DOB: November 30, 1965 , 55 y.o.   MRN: [id number]   Chief Complaint  Patient presents with   Referral    HPI  Patient would like a referral to a dermatologist. She has been having some itching and burning under her armpits for the past 2 weeks. She range the discomfort as an 8/10 now down to 5/10. She started a new deodorant. Never any raised whelps. Razor shaves. She stopped the deodorant [employer] all natural glide.  She has been self treating. She also reports having a growth on her left arm that she would like you to look at.   Wt Readings from Last 3 Encounters: 01/04/21 : 206 lb 6.4 oz (93.6 kg) 04/23/20 : 196 lb 12.8 oz (89.3 kg) 09/09/19 : 205 lb (93 kg)      Past Medical History:  Diagnosis Date   Abdominal pain, periumbilical    Acute constipation    Anemia    Clotting disorder (HCC)    Pulmonary embolism ([place]) 2009     Family History  Problem Relation Age of Onset   Cancer Mother    Cancer Father      Current Outpatient Medications:    triamcinolone (NASACORT) 55 MCG/ACT AERO nasal inhaler, Place 2 sprays into the nose daily. (Patient taking differently: Place 2 sprays into the nose as needed.), Disp: 1 Inhaler, Rfl: 0   No Known Allergies   Review of Systems  Constitutional: Negative.   HENT:  Positive for ear pain (left ear). Negative for ear discharge.   Respiratory: Negative.  Negative for  cough.   Cardiovascular: Negative.   Endocrine: Negative for polydipsia, polyphagia and polyuria.  Neurological:  Negative for headaches.  Psychiatric/Behavioral: Negative.      Today's Vitals   01/04/21 1607  BP: 122/80  Pulse: 67  Temp: 98.2 F (36.8 C)  Weight: 206 lb 6.4 oz (93.6 kg)  Height: 5' 8.6" (1.742 m)  PainSc: 0-No pain   Body mass index is 30.84 kg/m.   Objective:  Physical Exam Vitals reviewed.  Constitutional:      General: She is not in acute distress.    Appearance: Normal appearance. She is obese.  Cardiovascular:     Rate and Rhythm: Normal rate and regular rhythm.     Pulses: Normal pulses.     Heart sounds: Normal heart sounds. No murmur heard. Pulmonary:     Effort: Pulmonary effort is normal. No respiratory distress.  Skin:    General: Skin is warm and dry.     Capillary Refill: Capillary refill takes less than 2 seconds.     Findings: No erythema, lesion or rash.     Comments: No rash noted to axilla. Semi soft lump to left forearm, moveable  Neurological:     General: No focal deficit present.     Mental Status: She is alert and oriented to person, place, and time.     Cranial Nerves: No cranial nerve deficit.  Motor: No weakness.  Psychiatric:        Mood and Affect: Mood normal.        Behavior: Behavior normal.        Thought Content: Thought content normal.        Judgment: Judgment normal.        Assessment And Plan:     1. Pruritic dermatitis Comments: No obvious rash however will refer to dermatology at patient request. Encouraged to stop using the natural deodorant to see if improves - Ambulatory referral to Dermatology  2. Lump of skin of left upper extremity Comments: semisoft lump to left upper arm, will refer to dermatology and obtain an ultrasound to check to see if lipoma - Ambulatory referral to Dermatology - [place] LT UPPER EXTREM LTD SOFT TISSUE NON VASCULAR; Future  3. Class 1 obesity due to excess calories  without serious comorbidity with body mass index (BMI) of 30.0 to 30.9 in adult Chronic Discussed healthy diet and regular exercise options  Encouraged to exercise at least 150 minutes per week with 2 days of strength training  4. Influenza vaccination declined  Patient declined influenza vaccination at this time. Patient is aware that influenza vaccine prevents illness in 70% of healthy Kristy [name], and reduces hospitalizations to 30-70% in elderly. This vaccine is recommended annually. Pt is willing to accept risk associated with refusing vaccination.   Patient was given opportunity to ask questions. Patient verbalized understanding of the plan and was able to repeat key elements of the plan. All questions were answered to their satisfaction.  Kristy Brine, FNP   I, Kristy Brine, FNP, have reviewed all documentation for this visit. The documentation on 01/04/21 for the exam, diagnosis, procedures, and orders are all accurate and complete.   IF YOU HAVE BEEN REFERRED TO A SPECIALIST, IT MAY TAKE 1-2 WEEKS TO SCHEDULE/PROCESS THE REFERRAL. IF YOU HAVE NOT HEARD FROM US/SPECIALIST IN TWO WEEKS, PLEASE GIVE [place] A CALL AT [phone] X [phone].   THE PATIENT IS ENCOURAGED TO PRACTICE SOCIAL DISTANCING DUE TO THE COVID-19 PANDEMIC.

## 2021-01-11 ENCOUNTER — Encounter: Payer: Self-pay | Admitting: Nurse Practitioner

## 2021-01-25 ENCOUNTER — Ambulatory Visit
Admission: RE | Admit: 2021-01-25 | Discharge: 2021-01-25 | Disposition: A | Discharge status: Home / Self Care | Payer: 59 | Source: Ambulatory Visit | Attending: Nurse Practitioner | Admitting: Nurse Practitioner

## 2021-01-25 DIAGNOSIS — R2232 Localized swelling, mass and lump, left upper limb: Secondary | ICD-10-CM

## 2021-04-29 ENCOUNTER — Ambulatory Visit (INDEPENDENT_AMBULATORY_CARE_PROVIDER_SITE_OTHER): Payer: 59 | Admitting: Nurse Practitioner

## 2021-04-29 ENCOUNTER — Encounter: Payer: Self-pay | Admitting: Nurse Practitioner

## 2021-04-29 ENCOUNTER — Other Ambulatory Visit: Payer: Self-pay

## 2021-04-29 VITALS — BP 116/80 | HR 70 | Temp 98.3°F | Ht 68.5 in | Wt 214.4 lb

## 2021-04-29 DIAGNOSIS — E559 Vitamin D deficiency, unspecified: Secondary | ICD-10-CM

## 2021-04-29 DIAGNOSIS — Z6832 Body mass index (BMI) 32.0-32.9, adult: Secondary | ICD-10-CM

## 2021-04-29 DIAGNOSIS — E6609 Other obesity due to excess calories: Secondary | ICD-10-CM

## 2021-04-29 DIAGNOSIS — Z0000 Encounter for general adult medical examination without abnormal findings: Secondary | ICD-10-CM | POA: Diagnosis not present

## 2021-04-29 DIAGNOSIS — R7303 Prediabetes: Secondary | ICD-10-CM

## 2021-04-29 DIAGNOSIS — Z2821 Immunization not carried out because of patient refusal: Secondary | ICD-10-CM

## 2021-04-29 NOTE — Progress Notes (Signed)
?[name] as a Education administrator for [employer], FNP.,have documented all relevant documentation on the behalf of Kristy Brine, FNP,as directed by  Kristy Brine, FNP while in the presence of Kristy [name], [place].  ?This visit occurred during the SARS-CoV-2 public health emergency.  Safety protocols were in place, including screening questions prior to the visit, additional usage of staff PPE, and extensive cleaning of exam room while observing appropriate contact time as indicated for disinfecting solutions. ? ?Subjective:  ?  ? Patient ID: Kristy [name] , female    DOB: [birth year] , 56 y.o.   MRN: [id number] ? ? ?Chief Complaint  ?Patient presents with  ? Annual Exam  ? ? ?HPI ? ?Patient is here for full physical exam. Her last PAP smear was in 2022. ? ?Wt Readings from Last 3 Encounters: ?04/29/21 : 214 lb 6.4 oz (97.3 kg) ?01/04/21 : 206 lb 6.4 oz (93.6 kg) ?04/23/20 : 196 lb 12.8 oz (89.3 kg) ? ? ?  ? ?Past Medical History:  ?Diagnosis Date  ? Abdominal pain, periumbilical   ? Acute constipation   ? Anemia   ? Clotting disorder ([place])   ? Pulmonary embolism ([place]) 2009  ?  ? ?Family History  ?Problem Relation Age of Onset  ? Cancer Mother   ? Cancer Father   ? ? ? ?Current Outpatient Medications:  ?  Multiple Vitamin (MULTIVITAMIN ADULT PO), Take by mouth. 1 tablespoon daily, Disp: , Rfl:  ?  OVER THE COUNTER MEDICATION, Chaga I per day, Disp: , Rfl:  ?  OVER THE COUNTER MEDICATION, ganoderma lucidium 1 per day, Disp: , Rfl:  ?  OVER THE COUNTER MEDICATION, complex po 1 per day, Disp: , Rfl:  ?  OVER THE COUNTER MEDICATION, life drops 1 drop per day, Disp: , Rfl:  ?  triamcinolone (NASACORT) 55 MCG/ACT AERO nasal inhaler, Place 2 sprays into the nose daily. (Patient not taking: Reported on 04/29/2021), Disp: 1 Inhaler, Rfl: 0  ? ?No Known Allergies  ? ? ?The patient states she uses status post hysterectomy for birth control.  No LMP recorded. Patient has had a hysterectomy.. Negative for Dysmenorrhea and  Negative for Menorrhagia. Negative for: breast discharge, breast lump(s), breast pain and breast self exam. Associated symptoms include abnormal vaginal bleeding. Pertinent negatives include abnormal bleeding (hematology), anxiety, decreased libido, depression, difficulty falling sleep, dyspareunia, history of infertility, nocturia, sexual dysfunction, sleep disturbances, urinary incontinence, urinary urgency, vaginal discharge and vaginal itching. Diet regular; admits to "falling off the wagon."  The patient states her exercise level is minimal has started back walking 2.5 miles 2 days a week.  ? ?The patient's tobacco use is:  ?Social History  ? ?Tobacco Use  ?Smoking Status Never  ?Smokeless Tobacco Never  ?Marland Kitchen She has been exposed to passive smoke. The patient's alcohol use is:  ?Social History  ? ?Substance and Sexual Activity  ?Alcohol Use No  ? ?Additional information: Last pap 2022, next one scheduled for 5 years.   ? ?Review of Systems  ?Constitutional: Negative.   ?HENT: Negative.    ?Eyes: Negative.   ?Respiratory: Negative.    ?Cardiovascular: Negative.   ?Gastrointestinal: Negative.   ?Endocrine: Negative.   ?Genitourinary: Negative.   ?Musculoskeletal: Negative.   ?Skin: Negative.   ?Allergic/Immunologic: Negative.   ?Neurological: Negative.   ?Hematological: Negative.   ?Psychiatric/Behavioral: Negative.     ? ?Today's Vitals  ? 04/29/21 0847  ?BP: 116/80  ?Pulse: 70  ?Temp: 98.3 ?F (36.8 ?C)  ?Weight: 214  lb 6.4 oz (97.3 kg)  ?Height: 5' 8.5" (1.74 m)  ? ?Body mass index is 32.13 kg/m?.  ?Wt Readings from Last 3 Encounters:  ?04/29/21 214 lb 6.4 oz (97.3 kg)  ?01/04/21 206 lb 6.4 oz (93.6 kg)  ?04/23/20 196 lb 12.8 oz (89.3 kg)  ?  ?BP Readings from Last 3 Encounters:  ?04/29/21 116/80  ?01/04/21 122/80  ?04/23/20 122/78  ?  ?Objective:  ?Physical Exam ?Vitals reviewed.  ?Constitutional:   ?   General: She is not in acute distress. ?   Appearance: Normal appearance. She is well-developed. She is obese.   ?HENT:  ?   Head: Normocephalic and atraumatic.  ?   Right Ear: Hearing, tympanic membrane, ear canal and external ear normal.  ?   Left Ear: Hearing, tympanic membrane, ear canal and external ear normal.  ?   Nose:  ?   Comments: Deferred - masked ?   Mouth/Throat:  ?   Comments: Deferred - masked ?Eyes:  ?   General: Lids are normal.  ?   Conjunctiva/sclera: Conjunctivae normal.  ?   Pupils: Pupils are equal, round, and reactive to light.  ?   Funduscopic exam: ?   Right eye: No papilledema.     ?   Left eye: No papilledema.  ?Neck:  ?   Thyroid: No thyroid mass.  ?   Vascular: No carotid bruit.  ?Cardiovascular:  ?   Rate and Rhythm: Normal rate and regular rhythm.  ?   Pulses: Normal pulses.  ?   Heart sounds: Normal heart sounds. No murmur heard. ?Pulmonary:  ?   Effort: Pulmonary effort is normal. No respiratory distress.  ?   Breath sounds: Normal breath sounds. No wheezing or rhonchi.  ?Abdominal:  ?   General: Abdomen is flat. Bowel sounds are normal. There is no distension.  ?   Palpations: Abdomen is soft.  ?   Tenderness: There is no abdominal tenderness.  ?Musculoskeletal:     ?   General: No swelling or tenderness. Normal range of motion.  ?   Cervical back: Full passive range of motion without pain, normal range of motion and neck supple.  ?Skin: ?   General: Skin is warm and dry.  ?   Capillary Refill: Capillary refill takes less than 2 seconds.  ?   Findings: No rash.  ?Neurological:  ?   General: No focal deficit present.  ?   Mental Status: She is alert and oriented to person, place, and time.  ?   Cranial Nerves: No cranial nerve deficit.  ?   Sensory: No sensory deficit.  ?Psychiatric:     ?   Mood and Affect: Mood normal.     ?   Behavior: Behavior normal.     ?   Thought Content: Thought content normal.     ?   Judgment: Judgment normal.  ?  ? ?   ?Assessment And Plan:  ?   ?1. Encounter for general adult medical examination w/o abnormal findings ?Behavior modifications discussed and diet  history reviewed.   ?Pt will continue to exercise regularly and modify diet with low GI, plant based foods and decrease intake of processed foods.  ?Recommend intake of daily multivitamin, Vitamin D, and calcium.  ?Recommend mammogram and colonoscopy for preventive screenings, as well as recommend immunizations that include influenza, TDAP, and Shingles (declined) ? ?2. Prediabetes ?Comments: HgbA1c was 6.1 at last visit, discussed increased risk to develop diabetes and similar complications related  to diabetes.  ?- Hemoglobin A1c ?- CBC ?- CMP14+EGFR ?- Lipid panel ? ?3. Vitamin D deficiency ?Will check vitamin D level and supplement as needed.    ?Also encouraged to spend 15 minutes in the sun daily.  ?- VITAMIN D 25 Hydroxy (Vit-D Deficiency, Fractures) ? ?4. Class 1 obesity due to excess calories without serious comorbidity with body mass index (BMI) of 32.0 to 32.9 in adult ?She is encouraged to strive for BMI less than 30 to decrease cardiac risk. Advised to aim for at least 150 minutes of exercise per week.  ?Unfortunately she has gained approximately 18 lbs since Feb 2022, encouraged to get back on track with her physical actvity.  ? ?5. COVID-19 vaccination declined ?Declines covid 19 vaccine. Discussed risk of covid 59 and if she changes her mind about the vaccine to call the office.  Encouraged to take multivitamin, vitamin d, vitamin c and zinc to increase immune system. Aware can call office if would like to have vaccine here at office.  ? ?6. Varicella zoster virus (VZV) vaccination declined ? ? ? ? ?Patient was given opportunity to ask questions. Patient verbalized understanding of the plan and was able to repeat key elements of the plan. All questions were answered to their satisfaction.  ? ?Kristy Brine, FNP  ? ?I, Kristy Brine, FNP, have reviewed all documentation for this visit. The documentation on 04/29/21 for the exam, diagnosis, procedures, and orders are all accurate and complete.  ?THE  PATIENT IS ENCOURAGED TO PRACTICE SOCIAL DISTANCING DUE TO THE COVID-19 PANDEMIC.   ?

## 2021-04-29 NOTE — Patient Instructions (Signed)

## 2021-04-30 LAB — CBC
Hematocrit: 37.7 % (ref 34.0–46.6)
Hemoglobin: 11.6 g/dL (ref 11.1–15.9)
MCH: 21.7 pg — ABNORMAL LOW (ref 26.6–33.0)
MCHC: 30.8 g/dL — ABNORMAL LOW (ref 31.5–35.7)
MCV: 71 fL — ABNORMAL LOW (ref 79–97)
Platelets: 220 10*3/uL (ref 150–450)
RBC: 5.34 x10E6/uL — ABNORMAL HIGH (ref 3.77–5.28)
RDW: 16.3 % — ABNORMAL HIGH (ref 11.7–15.4)
WBC: 5.9 10*3/uL (ref 3.4–10.8)

## 2021-04-30 LAB — VITAMIN D 25 HYDROXY (VIT D DEFICIENCY, FRACTURES): Vit D, 25-Hydroxy: 42.3 ng/mL (ref 30.0–100.0)

## 2021-04-30 LAB — CMP14+EGFR
ALT: 18 IU/L (ref 0–32)
AST: 15 IU/L (ref 0–40)
Albumin/Globulin Ratio: 1.5 (ref 1.2–2.2)
Albumin: 4 g/dL (ref 3.8–4.9)
Alkaline Phosphatase: 95 IU/L (ref 44–121)
BUN/Creatinine Ratio: 18 (ref 9–23)
BUN: 16 mg/dL (ref 6–24)
Bilirubin Total: <0.2 mg/dL (ref 0.0–1.2)
CO2: 23 mmol/L (ref 20–29)
Calcium: 9.2 mg/dL (ref 8.7–10.2)
Chloride: 104 mmol/L (ref 96–106)
Creatinine, Ser: 0.91 mg/dL (ref 0.57–1.00)
Globulin, Total: 2.6 g/dL (ref 1.5–4.5)
Glucose: 102 mg/dL — ABNORMAL HIGH (ref 70–99)
Potassium: 4.2 mmol/L (ref 3.5–5.2)
Sodium: 143 mmol/L (ref 134–144)
Total Protein: 6.6 g/dL (ref 6.0–8.5)
eGFR: 74 mL/min/{1.73_m2} (ref >59)

## 2021-04-30 LAB — LIPID PANEL
Chol/HDL Ratio: 2.6 ratio (ref 0.0–4.4)
Cholesterol, Total: 194 mg/dL (ref 100–199)
HDL: 75 mg/dL (ref >39)
LDL Chol Calc (NIH): 106 mg/dL — ABNORMAL HIGH (ref 0–99)
Triglycerides: 73 mg/dL (ref 0–149)
VLDL Cholesterol Cal: 13 mg/dL (ref 5–40)

## 2021-04-30 LAB — HEMOGLOBIN A1C
Est. average glucose Bld gHb Est-mCnc: 137 mg/dL
Hgb A1c MFr Bld: 6.4 % — ABNORMAL HIGH (ref 4.8–5.6)

## 2021-08-18 ENCOUNTER — Ambulatory Visit (INDEPENDENT_AMBULATORY_CARE_PROVIDER_SITE_OTHER): Payer: 59

## 2021-08-18 ENCOUNTER — Encounter (HOSPITAL_COMMUNITY): Payer: Self-pay | Admitting: Emergency Medicine

## 2021-08-18 ENCOUNTER — Ambulatory Visit (HOSPITAL_COMMUNITY)
Admission: EM | Admit: 2021-08-18 | Discharge: 2021-08-18 | Disposition: A | Discharge status: Home / Self Care | Payer: 59 | Attending: Internal Medicine | Admitting: Internal Medicine

## 2021-08-18 DIAGNOSIS — M79652 Pain in left thigh: Secondary | ICD-10-CM | POA: Diagnosis not present

## 2021-08-18 DIAGNOSIS — M25532 Pain in left wrist: Secondary | ICD-10-CM

## 2021-08-18 DIAGNOSIS — W19XXXA Unspecified fall, initial encounter: Secondary | ICD-10-CM | POA: Diagnosis not present

## 2021-08-18 NOTE — ED Triage Notes (Signed)
Pt is present today with left wrist from a fall this morning. Pt states that she might have hit her head but cannot remember. Pt denies LOC.

## 2021-08-18 NOTE — Discharge Instructions (Signed)
Your x-ray did not show any fractures.  You can ice the area and also use Tylenol or ibuprofen as needed.  You can continue to use this as tolerated.  If you are not having improvement in your symptoms, especially if you have worsening, you should be seen by medical provider.  You can call the [facility] sports medicine office to schedule an appointment in 1 to 2 weeks if not making improvement.

## 2021-08-18 NOTE — ED Provider Notes (Signed)
[name]    CSN: [id number] Arrival date & time: 08/18/21  2440      History   Chief Complaint Chief Complaint  Patient presents with   Fall   Wrist Pain    HPI Kristy [name] is a 56 y.o. female.   Left wrist pain Patient was trying to leave for a trip this morning when a duffel bag got caught on a door in her garage and she fell hitting her arm between a wall and a water heater States that she had swelling over the dorsal aspect of her left wrist immediately Hit her head, but does not have any pain in this area and did not have any loss of consciousness She also reports some slight soreness on the left side of her thigh, but is able to walk without difficulty Pain in the wrist does radiate up to the fingers    Past Medical History:  Diagnosis Date   Abdominal pain, periumbilical    Acute constipation    Anemia    Clotting disorder ([place])    Pulmonary embolism ([place]) 2009    Patient Active Problem List   Diagnosis Date Noted   Obesity (BMI 30-39.9) 03/11/2019   Dysuria 09/20/2018   Prediabetes 03/07/2018   Vitamin D deficiency 03/07/2018   Abdominal pain, generalized 11/25/2010   Diastasis recti 11/25/2010    Past Surgical History:  Procedure Laterality Date   ABDOMINAL HYSTERECTOMY  December 2010   ABDOMINAL SURGERY  january 2011   internal bleeding     OB History   No obstetric history on file.      Home Medications    Prior to Admission medications   Medication Sig Start Date End Date Taking? Authorizing Provider  Multiple Vitamin (MULTIVITAMIN ADULT PO) Take by mouth. 1 tablespoon daily    [provider]  OVER THE COUNTER MEDICATION Chaga I per day    [provider]  OVER THE COUNTER MEDICATION ganoderma lucidium 1 per day    [provider]  OVER THE COUNTER MEDICATION complex po 1 per day    [provider]  OVER THE COUNTER MEDICATION life drops 1 drop per day    [provider]  triamcinolone (NASACORT) 55 MCG/ACT AERO nasal inhaler Place 2 sprays into the nose daily. Patient not taking: Reported on 04/29/2021 10/12/18   Zigmund Gottron, NP    Family History Family History  Problem Relation Age of Onset   Cancer Mother    Cancer Father     Social History Social History   Tobacco Use   Smoking status: Never   Smokeless tobacco: Never  Substance Use Topics   Alcohol use: No   Drug use: No     Allergies   Patient has no known allergies.   Review of Systems Review of Systems  All other systems reviewed and are negative.  Per HPI  Physical Exam Triage Vital Signs ED Triage Vitals  Enc Vitals Group     BP 08/18/21 0857 136/83     Pulse Rate 08/18/21 0857 69     Resp 08/18/21 0857 17     Temp 08/18/21 0857 98.1 F (36.7 C)     Temp src --      SpO2 08/18/21 0857 95 %     Weight --      Height --      Head Circumference --      Peak Flow --      Pain  Score 08/18/21 0856 6     Pain Loc --      Pain Edu? --      Excl. in [place]? --    No data found.  Updated Vital Signs BP 136/83   Pulse 69   Temp 98.1 F (36.7 C)   Resp 17   SpO2 95%   Visual Acuity Right Eye Distance:   Left Eye Distance:   Bilateral Distance:    Right Eye Near:   Left Eye Near:    Bilateral Near:     Physical Exam Constitutional:      General: She is not in acute distress.    Appearance: Normal appearance. She is not ill-appearing.  HENT:     Head: Normocephalic and atraumatic.  Eyes:     Extraocular Movements: Extraocular movements intact.     Conjunctiva/sclera: Conjunctivae normal.     Pupils: Pupils are equal, round, and reactive to light.  Cardiovascular:     Rate and Rhythm: Normal rate.  Pulmonary:     Effort: Pulmonary effort is normal. No respiratory distress.  Musculoskeletal:     Cervical back: Normal range of motion and neck supple. No rigidity or tenderness.     Comments: Left Wrist: Inspection: There is mild swelling  and bruising to the dorsal aspect of the left wrist. Palpation: Tenderness palpation in the region of the swelling, however there is no specific tenderness to palpation over the anatomic snuffbox, metacarpals, elbow ROM: Flexion and extension of wrist is intact, but limited secondary to pain and swelling.  Full range of motion of digits. Strength: 5/5 forearm and grip strength. Neurovascular: NV intact b/l  Left hip:  - Inspection: No gross deformity, no swelling, erythema, or ecchymosis b/l - Palpation: Mild tenderness to palpation of the soft tissue of the lateral thigh, none specifically over the greater trochanter or the femur - ROM: Normal range of motion on Flexion, extension, abduction, internal and external rotation b/l - Strength: Normal strength in all fields b/l, normal gait - Neuro/vasc: NV intact distally b/l     Skin:    General: Skin is warm and dry.  Neurological:     General: No focal deficit present.     Mental Status: She is alert and oriented to person, place, and time.     Cranial Nerves: No cranial nerve deficit.     Sensory: No sensory deficit.  Psychiatric:        Mood and Affect: Mood normal.        Behavior: Behavior normal.      UC Treatments / Results  Labs (all labs ordered are listed, but only abnormal results are displayed) Labs Reviewed - No data to display  EKG   Radiology DG Wrist Complete Left  Result Date: 08/18/2021 CLINICAL DATA:  Fall, pain EXAM: LEFT WRIST - COMPLETE 3+ VIEW COMPARISON:  None Available. FINDINGS: There is no evidence of fracture or dislocation. There is no evidence of arthropathy or other focal bone abnormality. Ulnar minus variant. The carpus is normally aligned. Soft tissue edema about the dorsal wrist. IMPRESSION: 1. No fracture or dislocation of the left wrist. The carpus is normally aligned. 2. Soft tissue edema about the dorsal wrist. Electronically Signed   By: Delanna Ahmadi M.D.   On: 08/18/2021 09:34     Procedures Procedures (including critical care time)  Medications Ordered in UC Medications - No data to display  Initial Impression / Assessment and Plan / UC Course  I have reviewed  the triage vital signs and the nursing notes.  Pertinent labs & imaging results that were available during my care of the patient were reviewed by me and considered in my medical decision making (see chart for details).     X-ray of wrist is negative for fracture.  Patient is neurologically intact on examination does not have any evidence of severe head injury to suggest need for imaging.  Neck exam also within normal limits.  No bony tenderness of left hip, no indication for imaging at this time.  Reassured patient of normal wrist x-ray and advised ice, Tylenol or ibuprofen as needed.  She can continue to use this as tolerated.  If still having pain over the next 1 to 2 weeks, recommend follow-up with sports medicine or orthopedic provider.   Final Clinical Impressions(s) / UC Diagnoses   Final diagnoses:  Left wrist pain  Fall, initial encounter  Left thigh pain     Discharge Instructions      Your x-ray did not show any fractures.  You can ice the area and also use Tylenol or ibuprofen as needed.  You can continue to use this as tolerated.  If you are not having improvement in your symptoms, especially if you have worsening, you should be seen by medical provider.  You can call the [facility] sports medicine office to schedule an appointment in 1 to 2 weeks if not making improvement.     ED Prescriptions   None    PDMP not reviewed this encounter.   [place], Bernita Raisin, DO 08/18/21 [id number]

## 2021-10-22 ENCOUNTER — Ambulatory Visit (INDEPENDENT_AMBULATORY_CARE_PROVIDER_SITE_OTHER): Payer: 59

## 2021-10-22 ENCOUNTER — Encounter (HOSPITAL_COMMUNITY): Payer: Self-pay

## 2021-10-22 ENCOUNTER — Ambulatory Visit (HOSPITAL_COMMUNITY)
Admission: EM | Admit: 2021-10-22 | Discharge: 2021-10-22 | Disposition: A | Discharge status: Home / Self Care | Payer: 59 | Attending: Family Medicine | Admitting: Family Medicine

## 2021-10-22 DIAGNOSIS — W19XXXA Unspecified fall, initial encounter: Secondary | ICD-10-CM

## 2021-10-22 DIAGNOSIS — M25512 Pain in left shoulder: Secondary | ICD-10-CM

## 2021-10-22 NOTE — ED Triage Notes (Signed)
Pt fell back in June injured left arm and wrist . Since the fall pt states she has not been able to have full use of the arm. Pt states when not moving arm she feels no pain but, when she has to use the arm she feels the pain and this has been ongoing since June of 2023

## 2021-10-22 NOTE — Discharge Instructions (Addendum)
You were seen today for left shoulder pain after a fall.  Your xray was negative for fracture or dislocation.  I recommend you follow up with your primary care provider to discuss next steps, such as starting physical therapy or discussing a need for further imaging.  You may continue topical medications, as well as trial over  the counter tylenol or motrin for pain.

## 2021-10-22 NOTE — ED Provider Notes (Signed)
[place]    CSN: [id number] Arrival date & time: 10/22/21  1101      History   Chief Complaint Chief Complaint  Patient presents with   Arm Injury    HPI Kristy [name] is a 56 y.o. female.   Patient is here for left arm pain.  Fell in June, landed on left side/arm;  initially had pain in the wrist and focused on that.  Seen in the ER.  Xray wrist negative.   She has noted that since then she really does not have full rom of the left shoulder, and is painful.  She has not seen her pcp for this.  This really started in the last month.  She has tried topical anti-inflammatories with help.   Past Medical History:  Diagnosis Date   Abdominal pain, periumbilical    Acute constipation    Anemia    Clotting disorder ([place])    Pulmonary embolism ([place]) 2009    Patient Active Problem List   Diagnosis Date Noted   Obesity (BMI 30-39.9) 03/11/2019   Dysuria 09/20/2018   Prediabetes 03/07/2018   Vitamin D deficiency 03/07/2018   Abdominal pain, generalized 11/25/2010   Diastasis recti 11/25/2010    Past Surgical History:  Procedure Laterality Date   ABDOMINAL HYSTERECTOMY  December 2010   ABDOMINAL SURGERY  january 2011   internal bleeding     OB History   No obstetric history on file.      Home Medications    Prior to Admission medications   Medication Sig Start Date End Date Taking? Authorizing Provider  Multiple Vitamin (MULTIVITAMIN ADULT PO) Take by mouth. 1 tablespoon daily    [provider]  OVER THE COUNTER MEDICATION [place] I per day    [provider]  OVER THE COUNTER MEDICATION ganoderma lucidium 1 per day    [provider]  OVER THE COUNTER MEDICATION complex po 1 per day    [provider]  OVER THE COUNTER MEDICATION life drops 1 drop per day    [provider]  triamcinolone (NASACORT) 55 MCG/ACT AERO nasal inhaler Place 2 sprays into the nose daily. Patient not taking: Reported  on 04/29/2021 10/12/18   Zigmund Gottron, NP    Family History Family History  Problem Relation Age of Onset   Cancer Mother    Cancer Father     Social History Social History   Tobacco Use   Smoking status: Never   Smokeless tobacco: Never  Substance Use Topics   Alcohol use: No   Drug use: No     Allergies   Patient has no known allergies.   Review of Systems Review of Systems  Constitutional: Negative.   HENT: Negative.    Respiratory: Negative.    Cardiovascular: Negative.   Gastrointestinal: Negative.   Genitourinary: Negative.      Physical Exam Triage Vital Signs ED Triage Vitals  Enc Vitals Group     BP 10/22/21 1124 102/67     Pulse Rate 10/22/21 1124 72     Resp 10/22/21 1124 12     Temp 10/22/21 1124 98.3 F (36.8 C)     Temp Source 10/22/21 1124 Oral     SpO2 10/22/21 1124 98 %     Weight 10/22/21 1122 210 lb (95.3 kg)     Height 10/22/21 1122 '5\' 8"'$  (1.727 m)     Head Circumference --      Peak Flow --  Pain Score 10/22/21 1120 0     Pain Loc --      Pain Edu? --      Excl. in [place]? --    No data found.  Updated Vital Signs BP 102/67 (BP Location: Right Arm)   Pulse 72   Temp 98.3 F (36.8 C) (Oral)   Resp 12   Ht '5\' 8"'$  (1.727 m)   Wt 95.3 kg   SpO2 98%   BMI 31.93 kg/m   Visual Acuity Right Eye Distance:   Left Eye Distance:   Bilateral Distance:    Right Eye Near:   Left Eye Near:    Bilateral Near:     Physical Exam Constitutional:      Appearance: Normal appearance.  Musculoskeletal:     Comments: No TTP to the left shoulder or arm;  She has limited active ROM with rotation in either direction;  passive ROM is also painful and slightly limted  Neurological:     General: No focal deficit present.     Mental Status: She is alert.  Psychiatric:        Mood and Affect: Mood normal.      UC Treatments / Results  Labs (all labs ordered are listed, but only abnormal results are displayed) Labs Reviewed - No  data to display  EKG   Radiology DG Shoulder Left  Result Date: 10/22/2021 CLINICAL DATA:  Trauma, fall, pain EXAM: LEFT SHOULDER - 2+ VIEW COMPARISON:  None Available. FINDINGS: There is no evidence of fracture or dislocation. There is no evidence of arthropathy or other focal bone abnormality. Soft tissues are unremarkable. IMPRESSION: No fracture or dislocation is seen left shoulder. Electronically Signed   By: Elmer Picker M.D.   On: 10/22/2021 12:09    Procedures Procedures (including critical care time)  Medications Ordered in UC Medications - No data to display  Initial Impression / Assessment and Plan / UC Course  I have reviewed the triage vital signs and the nursing notes.  Pertinent labs & imaging results that were available during my care of the patient were reviewed by me and considered in my medical decision making (see chart for details).    Final Clinical Impressions(s) / UC Diagnoses   Final diagnoses:  Acute pain of left shoulder     Discharge Instructions      You were seen today for left shoulder pain after a fall.  Your xray was negative for fracture or dislocation.  I recommend you follow up with your primary care provider to discuss next steps, such as starting physical therapy or discussing a need for further imaging.  You may continue topical medications, as well as trial over  the counter tylenol or motrin for pain.     ED Prescriptions   None    PDMP not reviewed this encounter.   Rondel Oh, MD 10/22/21 1216

## 2021-10-29 DIAGNOSIS — M79672 Pain in left foot: Secondary | ICD-10-CM | POA: Insufficient documentation

## 2021-11-18 DIAGNOSIS — M7502 Adhesive capsulitis of left shoulder: Secondary | ICD-10-CM | POA: Insufficient documentation

## 2021-11-18 DIAGNOSIS — D1720 Benign lipomatous neoplasm of skin and subcutaneous tissue of unspecified limb: Secondary | ICD-10-CM | POA: Insufficient documentation

## 2021-12-20 LAB — HM MAMMOGRAPHY

## 2022-03-25 ENCOUNTER — Encounter: Payer: Self-pay | Admitting: Nurse Practitioner

## 2022-05-03 ENCOUNTER — Encounter: Payer: Self-pay | Admitting: Nurse Practitioner

## 2022-05-03 ENCOUNTER — Ambulatory Visit (INDEPENDENT_AMBULATORY_CARE_PROVIDER_SITE_OTHER): Payer: 59 | Admitting: Nurse Practitioner

## 2022-05-03 VITALS — BP 118/64 | HR 61 | Temp 98.0°F | Ht 68.0 in | Wt 214.0 lb

## 2022-05-03 DIAGNOSIS — Z0000 Encounter for general adult medical examination without abnormal findings: Secondary | ICD-10-CM

## 2022-05-03 DIAGNOSIS — E559 Vitamin D deficiency, unspecified: Secondary | ICD-10-CM

## 2022-05-03 DIAGNOSIS — R7303 Prediabetes: Secondary | ICD-10-CM

## 2022-05-03 DIAGNOSIS — M7502 Adhesive capsulitis of left shoulder: Secondary | ICD-10-CM

## 2022-05-03 DIAGNOSIS — N951 Menopausal and female climacteric states: Secondary | ICD-10-CM | POA: Diagnosis not present

## 2022-05-03 DIAGNOSIS — Z2821 Immunization not carried out because of patient refusal: Secondary | ICD-10-CM

## 2022-05-03 DIAGNOSIS — Z136 Encounter for screening for cardiovascular disorders: Secondary | ICD-10-CM

## 2022-05-03 DIAGNOSIS — Z13220 Encounter for screening for lipoid disorders: Secondary | ICD-10-CM

## 2022-05-03 NOTE — Progress Notes (Signed)
I,Kristy [name],acting as a Education administrator for Kristy Brine, FNP.,have documented all relevant documentation on the behalf of Kristy Brine, FNP,as directed by  Kristy Brine, FNP while in the presence of Kristy [name], [place].   Subjective:     Patient ID: Kristy [name] , female    DOB: 08-31-1965 , 57 y.o.   MRN: [id number]   Chief Complaint  Patient presents with   Annual Exam    HPI  Patient presents today for annual exam. No concerns. Continues to have frozen shoulder, massage therapy which has improved but still has problems with some range of motion.  She is requesting to be checked for thalassemia minor due to family history. She feels like she did not   Wt Readings from Last 3 Encounters: 05/03/22 : 214 lb (97.1 kg) 10/22/21 : 210 lb (95.3 kg) 04/29/21 : 214 lb 6.4 oz (97.3 kg)      Past Medical History:  Diagnosis Date   Abdominal pain, periumbilical    Acute constipation    Anemia    Clotting disorder ([place])    Pulmonary embolism ([place]) 2009     Family History  Problem Relation Age of Onset   Cancer Mother    Cancer Father      Current Outpatient Medications:    Multiple Vitamin (MULTIVITAMIN ADULT PO), Take by mouth. 1 tablespoon daily, Disp: , Rfl:    OVER THE COUNTER MEDICATION, Optimal V, Disp: , Rfl:    OVER THE COUNTER MEDICATION, Optimal M, Disp: , Rfl:    OVER THE COUNTER MEDICATION, Tahitian Noni Juice, Disp: , Rfl:    OVER THE COUNTER MEDICATION, Vinali, Disp: , Rfl:    OVER THE COUNTER MEDICATION, Omega Q, Disp: , Rfl:    OVER THE COUNTER MEDICATION, Maginacal D, Disp: , Rfl:    OVER THE COUNTER MEDICATION, Xceler8, Disp: , Rfl:    OVER THE COUNTER MEDICATION, Xceler8 Slenderiiz, Disp: , Rfl:    No Known Allergies    The patient states she is status post hysterectomy.   No LMP recorded. Patient has had a hysterectomy.. Negative for Dysmenorrhea and Negative for Menorrhagia. Negative for: breast discharge, breast lump(s), breast pain and breast  self exam. Associated symptoms include abnormal vaginal bleeding. Pertinent negatives include abnormal bleeding (hematology), anxiety, decreased libido, depression, difficulty falling sleep, dyspareunia, history of infertility, nocturia, sexual dysfunction, sleep disturbances, urinary incontinence, urinary urgency, vaginal discharge and vaginal itching. Diet regular. She tries to limit her sweets and cut back on pasta. The patient states her exercise level is minimal - she is getting back to exercising. The cold air caused her left shoulder to have pain. She is back walking and using a [employer].   The patient's tobacco use is:  Social History   Tobacco Use  Smoking Status Never  Smokeless Tobacco Never  . She has been exposed to passive smoke. The patient's alcohol use is:  Social History   Substance and Sexual Activity  Alcohol Use No  Additional information: Last pap 04/23/2020, next one scheduled for 04/24/2023.    Review of Systems  Constitutional: Negative.   HENT: Negative.    Eyes: Negative.   Respiratory: Negative.    Cardiovascular: Negative.   Gastrointestinal: Negative.   Endocrine: Negative.   Genitourinary: Negative.   Musculoskeletal: Negative.   Skin: Negative.   Allergic/Immunologic: Negative.   Neurological: Negative.   Hematological: Negative.   Psychiatric/Behavioral: Negative.       Today's Vitals   05/03/22 0859  BP:  118/64  Pulse: 61  Temp: 98 F (36.7 C)  TempSrc: Oral  SpO2: 96%  Weight: 214 lb (97.1 kg)  Height: '5\' 8"'$  (1.727 m)   Body mass index is 32.54 kg/m.   Objective:  Physical Exam Vitals reviewed.  Constitutional:      General: She is not in acute distress.    Appearance: Normal appearance. She is well-developed. She is obese.  HENT:     Head: Normocephalic and atraumatic.     Right Ear: Hearing, tympanic membrane, ear canal and external ear normal. There is no impacted cerumen.     Left Ear: Hearing, tympanic membrane, ear canal  and external ear normal. There is no impacted cerumen.     Nose: Nose normal.     Mouth/Throat:     Mouth: Mucous membranes are moist.  Eyes:     General: Lids are normal.     Extraocular Movements: Extraocular movements intact.     Conjunctiva/sclera: Conjunctivae normal.     Pupils: Pupils are equal, round, and reactive to light.     Funduscopic exam:    Right eye: No papilledema.        Left eye: No papilledema.  Neck:     Thyroid: No thyroid mass.     Vascular: No carotid bruit.  Cardiovascular:     Rate and Rhythm: Normal rate and regular rhythm.     Pulses: Normal pulses.     Heart sounds: Normal heart sounds. No murmur heard. Pulmonary:     Effort: Pulmonary effort is normal. No respiratory distress.     Breath sounds: Normal breath sounds. No wheezing or rhonchi.  Abdominal:     General: Abdomen is flat. Bowel sounds are normal. There is no distension.     Palpations: Abdomen is soft.     Tenderness: There is no abdominal tenderness.  Musculoskeletal:        General: No swelling or tenderness.     Cervical back: Full passive range of motion without pain, normal range of motion and neck supple.     Comments: Decreased range of motion to left shoulder  Skin:    General: Skin is warm and dry.     Capillary Refill: Capillary refill takes less than 2 seconds.     Findings: No rash.  Neurological:     General: No focal deficit present.     Mental Status: She is alert and oriented to person, place, and time.     Cranial Nerves: No cranial nerve deficit.     Sensory: No sensory deficit.  Psychiatric:        Mood and Affect: Mood normal.        Behavior: Behavior normal.        Thought Content: Thought content normal.        Judgment: Judgment normal.         Assessment And Plan:     1. Encounter for general adult medical examination w/o abnormal findings Behavior modifications discussed and diet history reviewed.   Pt will continue to exercise regularly and modify  diet with low GI, plant based foods and decrease intake of processed foods.  Recommend intake of daily multivitamin, Vitamin D, and calcium.  Recommend mammogram and colonoscopy for preventive screenings, as well as recommend immunizations that include influenza, TDAP, and shingles (declined all vaccines) - CBC - CMP14+EGFR  2. Encounter for lipid screening for cardiovascular disease - Lipid panel  3. Influenza vaccination declined Comments: Refuses to have any  vaccines, these were removed from her HM  4. COVID-19 vaccination declined Comments: Refuses to have any vaccines, these were removed from her HM  5. Vitamin D deficiency Will check vitamin D level and supplement as needed.    Also encouraged to spend 15 minutes in the sun daily.  - VITAMIN D 25 Hydroxy (Vit-D Deficiency, Fractures)  6. Prediabetes Comments: HgbA1c was 6.4 at last visit, continue focusing on healthy diet low in sugar and starches. - Hemoglobin A1c  7. Adhesive capsulitis of left shoulder Comments: Left arm with limited range of motion, discussed options to include laser vs dry needling, will let me know if she wants a referral to PT  8. Hot flashes due to menopause Comments: Discussed limiting high carb foods late in evening to help with hot flashes.   Patient was given opportunity to ask questions. Patient verbalized understanding of the plan and was able to repeat key elements of the plan. All questions were answered to their satisfaction.   Kristy Brine, FNP   I, Kristy Brine, FNP, have reviewed all documentation for this visit. The documentation on 05/03/22 for the exam, diagnosis, procedures, and orders are all accurate and complete.   THE PATIENT IS ENCOURAGED TO PRACTICE SOCIAL DISTANCING DUE TO THE COVID-19 PANDEMIC.

## 2022-05-04 LAB — CMP14+EGFR
ALT: 8 IU/L (ref 0–32)
AST: 13 IU/L (ref 0–40)
Albumin/Globulin Ratio: 1.4 (ref 1.2–2.2)
Albumin: 4.2 g/dL (ref 3.8–4.9)
Alkaline Phosphatase: 99 IU/L (ref 44–121)
BUN/Creatinine Ratio: 11 (ref 9–23)
BUN: 13 mg/dL (ref 6–24)
Bilirubin Total: 0.2 mg/dL (ref 0.0–1.2)
CO2: 25 mmol/L (ref 20–29)
Calcium: 9.3 mg/dL (ref 8.7–10.2)
Chloride: 104 mmol/L (ref 96–106)
Creatinine, Ser: 1.14 mg/dL — ABNORMAL HIGH (ref 0.57–1.00)
Globulin, Total: 2.9 g/dL (ref 1.5–4.5)
Glucose: 102 mg/dL — ABNORMAL HIGH (ref 70–99)
Potassium: 4.2 mmol/L (ref 3.5–5.2)
Sodium: 141 mmol/L (ref 134–144)
Total Protein: 7.1 g/dL (ref 6.0–8.5)
eGFR: 56 mL/min/{1.73_m2} — ABNORMAL LOW (ref >59)

## 2022-05-04 LAB — CBC
Hematocrit: 37.8 % (ref 34.0–46.6)
Hemoglobin: 11.5 g/dL (ref 11.1–15.9)
MCH: 21.5 pg — ABNORMAL LOW (ref 26.6–33.0)
MCHC: 30.4 g/dL — ABNORMAL LOW (ref 31.5–35.7)
MCV: 71 fL — ABNORMAL LOW (ref 79–97)
Platelets: 246 10*3/uL (ref 150–450)
RBC: 5.34 x10E6/uL — ABNORMAL HIGH (ref 3.77–5.28)
RDW: 16.6 % — ABNORMAL HIGH (ref 11.7–15.4)
WBC: 5.7 10*3/uL (ref 3.4–10.8)

## 2022-05-04 LAB — HEMOGLOBIN A1C
Est. average glucose Bld gHb Est-mCnc: 143 mg/dL
Hgb A1c MFr Bld: 6.6 % — ABNORMAL HIGH (ref 4.8–5.6)

## 2022-05-04 LAB — LIPID PANEL
Chol/HDL Ratio: 2.5 ratio (ref 0.0–4.4)
Cholesterol, Total: 209 mg/dL — ABNORMAL HIGH (ref 100–199)
HDL: 83 mg/dL (ref >39)
LDL Chol Calc (NIH): 115 mg/dL — ABNORMAL HIGH (ref 0–99)
Triglycerides: 64 mg/dL (ref 0–149)
VLDL Cholesterol Cal: 11 mg/dL (ref 5–40)

## 2022-05-04 LAB — VITAMIN D 25 HYDROXY (VIT D DEFICIENCY, FRACTURES): Vit D, 25-Hydroxy: 35.9 ng/mL (ref 30.0–100.0)

## 2022-05-06 LAB — IRON AND TIBC
Iron Saturation: 17 % (ref 15–55)
Iron: 49 ug/dL (ref 27–159)
Total Iron Binding Capacity: 295 ug/dL (ref 250–450)
UIBC: 246 ug/dL (ref 131–425)

## 2022-05-06 LAB — FERRITIN: Ferritin: 117 ng/mL (ref 15–150)

## 2022-05-06 LAB — SPECIMEN STATUS REPORT

## 2022-05-24 NOTE — Telephone Encounter (Signed)
These supplements are all updated.

## 2022-11-07 ENCOUNTER — Encounter: Payer: Self-pay | Admitting: Nurse Practitioner

## 2022-11-07 ENCOUNTER — Ambulatory Visit: Payer: 59 | Admitting: Nurse Practitioner

## 2022-11-07 VITALS — BP 120/76 | HR 65 | Temp 98.1°F | Ht 68.0 in | Wt 210.0 lb

## 2022-11-07 DIAGNOSIS — L918 Other hypertrophic disorders of the skin: Secondary | ICD-10-CM | POA: Insufficient documentation

## 2022-11-07 DIAGNOSIS — R7303 Prediabetes: Secondary | ICD-10-CM

## 2022-11-07 DIAGNOSIS — G8929 Other chronic pain: Secondary | ICD-10-CM

## 2022-11-07 DIAGNOSIS — M25512 Pain in left shoulder: Secondary | ICD-10-CM | POA: Diagnosis not present

## 2022-11-07 DIAGNOSIS — E66811 Obesity, class 1: Secondary | ICD-10-CM

## 2022-11-07 DIAGNOSIS — E6609 Other obesity due to excess calories: Secondary | ICD-10-CM | POA: Insufficient documentation

## 2022-11-07 DIAGNOSIS — Z6831 Body mass index (BMI) 31.0-31.9, adult: Secondary | ICD-10-CM

## 2022-11-07 DIAGNOSIS — Z2821 Immunization not carried out because of patient refusal: Secondary | ICD-10-CM

## 2022-11-07 DIAGNOSIS — M79601 Pain in right arm: Secondary | ICD-10-CM

## 2022-11-07 NOTE — Assessment & Plan Note (Signed)

## 2022-11-07 NOTE — Assessment & Plan Note (Signed)
hgbA1c is 6.6 at last visit, I have discussed with her having diabetes when her HgbA1c is above 6.4, she is taking supplements but half the dose would like a letter of necessity for her multivitamins

## 2022-11-07 NOTE — Assessment & Plan Note (Signed)
Decreased range of motion and has obvious "clicking" to her left shoulder. Decreased strength of 1+.

## 2022-11-07 NOTE — Assessment & Plan Note (Signed)
Goal to exercise 150 minutes per week with at least 2 days of strength training Encouraged to park further when at the store, take stairs instead of elevators and to walk in place during commercials. Increase water intake to at least one gallon of water daily. She is encouraged to strive for BMI less than 30 to decrease cardiac risk.

## 2022-11-07 NOTE — Assessment & Plan Note (Signed)
Decreased range of motion and has pain to right upper medial arm

## 2022-11-07 NOTE — Assessment & Plan Note (Signed)
Declines shingrix, educated on disease process and is aware if he changes his mind to notify office  

## 2022-11-07 NOTE — Progress Notes (Signed)
[name], CMA,acting as a Neurosurgeon for Arnette Felts, FNP.,have documented all relevant documentation on the behalf of Arnette Felts, FNP,as directed by  Arnette Felts, FNP while in the presence of Arnette Felts, FNP.  Subjective:  Patient ID: Kristy [name] , female    DOB: 1965-09-28 , 57 y.o.   MRN: [id number]  Chief Complaint  Patient presents with   Prediabetes    HPI  Patient presents today for a Pre DM follow up. She has been working on her diet and exercise a little. Patient reports compliance with medications. Patient denies any chest pain, SOB, or headaches. Patient wants a referral to a different dermatologist for her moles. She reports she was previously seen for frozen shoulder in her left arm. She feels it is now on her right shoulder as well. She feels her left shoulder is clicking when she moves it. She would like to go to [employer].      Past Medical History:  Diagnosis Date   Abdominal pain, periumbilical    Acute constipation    Anemia    Clotting disorder ([facility])    Pulmonary embolism ([facility]) 2009     Family History  Problem Relation Age of Onset   Cancer Mother    Cancer Father      Current Outpatient Medications:    Multiple Vitamin (MULTIVITAMIN ADULT PO), Take by mouth. 1 tablespoon daily, Disp: , Rfl:    OVER THE COUNTER MEDICATION, Optimal V, Disp: , Rfl:    OVER THE COUNTER MEDICATION, Optimal M, Disp: , Rfl:    OVER THE COUNTER MEDICATION, Tahitian Noni Juice, Disp: , Rfl:    OVER THE COUNTER MEDICATION, Vinali, Disp: , Rfl:    OVER THE COUNTER MEDICATION, Omega Q, Disp: , Rfl:    OVER THE COUNTER MEDICATION, Maginacal D, Disp: , Rfl:    OVER THE COUNTER MEDICATION, Xceler8, Disp: , Rfl:    OVER THE COUNTER MEDICATION, Xceler8 Slenderiix, Disp: , Rfl:    No Known Allergies   Review of Systems  Constitutional: Negative.   HENT: Negative.    Eyes: Negative.   Respiratory: Negative.    Cardiovascular: Negative.   Gastrointestinal:  Negative.   Musculoskeletal:        Shoulder pain  Neurological: Negative.   Psychiatric/Behavioral: Negative.       Today's Vitals   11/07/22 0911  BP: 120/76  Pulse: 65  Temp: 98.1 F (36.7 C)  Weight: 210 lb (95.3 kg)  Height: 5\' 8"  (1.727 m)  PainLoc: Shoulder   Body mass index is 31.93 kg/m.  Wt Readings from Last 3 Encounters:  11/07/22 210 lb (95.3 kg)  05/03/22 214 lb (97.1 kg)  10/22/21 210 lb (95.3 kg)      Objective:  Physical Exam Vitals reviewed.  Constitutional:      General: She is not in acute distress.    Appearance: Normal appearance. She is well-developed. She is obese.  Cardiovascular:     Rate and Rhythm: Normal rate and regular rhythm.     Pulses: Normal pulses.     Heart sounds: Normal heart sounds. No murmur heard. Pulmonary:     Effort: Pulmonary effort is normal. No respiratory distress.     Breath sounds: Normal breath sounds.  Chest:     Chest wall: No tenderness.  Musculoskeletal:        General: Tenderness (bilateral shoulders) present. No deformity.     Right shoulder: Decreased range of motion (more than left). Decreased strength (mild  1+).     Left shoulder: Decreased range of motion. Decreased strength (mild 1+).  Skin:    General: Skin is warm and dry.     Capillary Refill: Capillary refill takes less than 2 seconds.  Neurological:     General: No focal deficit present.     Mental Status: She is alert and oriented to person, place, and time.  Psychiatric:        Mood and Affect: Mood normal.        Behavior: Behavior normal.        Thought Content: Thought content normal.        Judgment: Judgment normal.         Assessment And Plan:  Prediabetes Assessment & Plan: hgbA1c is 6.6 at last visit, I have discussed with her having diabetes when her HgbA1c is above 6.4, she is taking supplements but half the dose would like a letter of necessity for her multivitamins  Orders: -     Basic metabolic panel -     Hemoglobin  A1c  Varicella zoster virus (VZV) vaccination declined Assessment & Plan: Declines shingrix, educated on disease process and is aware if he changes his mind to notify office    Skin tags, multiple acquired Assessment & Plan: Will refer to Dr Onalee Hua, she did not have a good connection with Dr. Terri Piedra.   Orders: -     Ambulatory referral to Dermatology  Chronic left shoulder pain Assessment & Plan: Decreased range of motion and has obvious "clicking" to her left shoulder. Decreased strength of 1+.   Orders: -     Ambulatory referral to Orthopedic Surgery  Right arm pain Assessment & Plan: Decreased range of motion and has pain to right upper medial arm  Orders: -     Ambulatory referral to Orthopedic Surgery  Class 1 obesity due to excess calories with body mass index (BMI) of 31.0 to 31.9 in adult, unspecified whether serious comorbidity present Assessment & Plan: Goal to exercise 150 minutes per week with at least 2 days of strength training Encouraged to park further when at the store, take stairs instead of elevators and to walk in place during commercials. Increase water intake to at least one gallon of water daily. She is encouraged to strive for BMI less than 30 to decrease cardiac risk.    Influenza vaccination declined Assessment & Plan: Patient declined influenza vaccination at this time. Patient is aware that influenza vaccine prevents illness in 70% of healthy people, and reduces hospitalizations to 30-70% in elderly. This vaccine is recommended annually. Education has been provided regarding the importance of this vaccine but patient still declined. Advised may receive this vaccine at local pharmacy or Health Dept.or vaccine clinic. Aware to provide a copy of the vaccination record if obtained from local pharmacy or Health Dept.  Pt is willing to accept risk associated with refusing vaccination.     Return for keep her next appt as scheduled .   Patient was  given opportunity to ask questions. Patient verbalized understanding of the plan and was able to repeat key elements of the plan. All questions were answered to their satisfaction.    Jeanell Sparrow, FNP, have reviewed all documentation for this visit. The documentation on 11/07/22 for the exam, diagnosis, procedures, and orders are all accurate and complete.   IF YOU HAVE BEEN REFERRED TO A SPECIALIST, IT MAY TAKE 1-2 WEEKS TO SCHEDULE/PROCESS THE REFERRAL. IF YOU HAVE NOT HEARD FROM US/SPECIALIST IN  TWO WEEKS, PLEASE GIVE [place] A CALL AT [phone].

## 2022-11-07 NOTE — Patient Instructions (Signed)
Goal to exercise 150 minutes per week with at least 2 days of strength training Encouraged to park further when at the store, take stairs instead of elevators and to walk in place during commercials. Increase water intake to at least one gallon of water daily. Once we receive your results for your HgbA1c I will let you know next steps.  Try to move more and focus on diet low in sugar and starches.

## 2022-11-07 NOTE — Assessment & Plan Note (Signed)
Will refer to Dr Onalee Hua, she did not have a good connection with Dr. Terri Piedra.

## 2022-11-08 LAB — BASIC METABOLIC PANEL
BUN/Creatinine Ratio: 16 (ref 9–23)
BUN: 15 mg/dL (ref 6–24)
CO2: 24 mmol/L (ref 20–29)
Calcium: 9.3 mg/dL (ref 8.7–10.2)
Chloride: 102 mmol/L (ref 96–106)
Creatinine, Ser: 0.96 mg/dL (ref 0.57–1.00)
Glucose: 100 mg/dL — ABNORMAL HIGH (ref 70–99)
Potassium: 4.2 mmol/L (ref 3.5–5.2)
Sodium: 140 mmol/L (ref 134–144)
eGFR: 69 mL/min/{1.73_m2} (ref >59)

## 2022-11-08 LAB — HEMOGLOBIN A1C
Est. average glucose Bld gHb Est-mCnc: 143 mg/dL
Hgb A1c MFr Bld: 6.6 % — ABNORMAL HIGH (ref 4.8–5.6)

## 2022-12-14 ENCOUNTER — Telehealth: Payer: Self-pay

## 2022-12-14 NOTE — Telephone Encounter (Signed)
Last office visit notes faxed to [phone].

## 2022-12-26 LAB — HM MAMMOGRAPHY

## 2022-12-28 ENCOUNTER — Encounter: Payer: Self-pay | Admitting: Internal Medicine

## 2022-12-30 ENCOUNTER — Encounter: Payer: Self-pay | Admitting: Nurse Practitioner

## 2023-01-04 ENCOUNTER — Encounter: Payer: Self-pay | Admitting: Nurse Practitioner

## 2023-03-01 ENCOUNTER — Encounter: Payer: Self-pay | Admitting: Nurse Practitioner

## 2023-03-06 ENCOUNTER — Encounter: Payer: Self-pay | Admitting: Nurse Practitioner

## 2023-03-06 ENCOUNTER — Ambulatory Visit: Payer: 59 | Admitting: Nurse Practitioner

## 2023-03-06 VITALS — BP 110/70 | HR 62 | Temp 98.3°F | Ht 68.0 in | Wt 206.6 lb

## 2023-03-06 DIAGNOSIS — M25512 Pain in left shoulder: Secondary | ICD-10-CM

## 2023-03-06 DIAGNOSIS — M25511 Pain in right shoulder: Secondary | ICD-10-CM | POA: Diagnosis not present

## 2023-03-06 DIAGNOSIS — E66811 Obesity, class 1: Secondary | ICD-10-CM

## 2023-03-06 DIAGNOSIS — G8929 Other chronic pain: Secondary | ICD-10-CM | POA: Diagnosis not present

## 2023-03-06 DIAGNOSIS — E6609 Other obesity due to excess calories: Secondary | ICD-10-CM

## 2023-03-06 DIAGNOSIS — Z2821 Immunization not carried out because of patient refusal: Secondary | ICD-10-CM | POA: Insufficient documentation

## 2023-03-06 DIAGNOSIS — Z6831 Body mass index (BMI) 31.0-31.9, adult: Secondary | ICD-10-CM

## 2023-03-06 NOTE — Assessment & Plan Note (Signed)
 Her right shoulder is worse now with limited range of motion mostly with internal rotation. She has crepitus to her left shoulder blade. Would like to go to [facility] with [employer] Physical therapist.

## 2023-03-06 NOTE — Assessment & Plan Note (Signed)
 Declines shingrix, educated on disease process and is aware if he changes his mind to notify office

## 2023-03-06 NOTE — Assessment & Plan Note (Signed)
 She is encouraged to strive for BMI less than 30 to decrease cardiac risk. Advised to aim for at least 150 minutes of exercise per week.

## 2023-03-06 NOTE — Progress Notes (Signed)
 Kristy [name], CMA,acting as a neurosurgeon for Kristy Ada, FNP.,have documented all relevant documentation on the behalf of Kristy Ada, FNP,as directed by  Kristy Ada, FNP while in the presence of Kristy Ada, FNP.  Subjective:  Patient ID: Kristy [name] , female    DOB: June 27, 1965 , 58 y.o.   MRN: [id number]  Chief Complaint  Patient presents with   Arm Pain    HPI  Patient presents today for shoulder pain on both sides, Patient reports compliance with medication. Patient denies any chest pain, SOB, or headaches. Patient would like to see physical therapy. She had started with frozen left shoulder, she did accupuncture and massage. Her ROM is better, continues to have popping and soreness daily. Then moved to right shoulder and now has decreased range of motion is limited. She has been using a massage ball which has been effective but continues to have pain.   Arm Pain  The incident occurred more than 1 week ago. There was no injury mechanism. The pain is present in the right shoulder and left shoulder. The quality of the pain is described as aching. Pertinent negatives include no chest pain. The symptoms are aggravated by movement.     Past Medical History:  Diagnosis Date   Abdominal pain, periumbilical    Acute constipation    Anemia    Clotting disorder (HCC)    Pulmonary embolism (HCC) 2009     Family History  Problem Relation Age of Onset   Cancer Mother    Cancer Father      Current Outpatient Medications:    Multiple Vitamin (MULTIVITAMIN ADULT PO), Take by mouth. 1 tablespoon daily, Disp: , Rfl:    OVER THE COUNTER MEDICATION, Optimal V, Disp: , Rfl:    OVER THE COUNTER MEDICATION, Optimal M, Disp: , Rfl:    OVER THE COUNTER MEDICATION, Tahitian Noni Juice, Disp: , Rfl:    OVER THE COUNTER MEDICATION, Vinali, Disp: , Rfl:    OVER THE COUNTER MEDICATION, Omega Q, Disp: , Rfl:    OVER THE COUNTER MEDICATION, Maginacal D, Disp: , Rfl:    OVER THE COUNTER  MEDICATION, Xceler8, Disp: , Rfl:    OVER THE COUNTER MEDICATION, Xceler8 Slenderiix, Disp: , Rfl:    No Known Allergies   Review of Systems  Cardiovascular:  Negative for chest pain.  Musculoskeletal:  Positive for arthralgias.       Bilateral shoulder pain with popping to left shoulder, right shoulder range of motion is limited.   Neurological:  Negative for dizziness.  Psychiatric/Behavioral: Negative.       Today's Vitals   03/06/23 1127  BP: 110/70  Pulse: 62  Temp: 98.3 F (36.8 C)  TempSrc: Oral  Weight: 206 lb 9.6 oz (93.7 kg)  Height: 5' 8 (1.727 m)  PainSc: 6   PainLoc: Shoulder   Body mass index is 31.41 kg/m.  Wt Readings from Last 3 Encounters:  03/06/23 206 lb 9.6 oz (93.7 kg)  11/07/22 210 lb (95.3 kg)  05/03/22 214 lb (97.1 kg)    The 10-year ASCVD risk score (Arnett DK, et al., 2019) is: 2.1%   Values used to calculate the score:     Age: 78 years     Sex: Female     Is Non-Hispanic African American: Yes     Diabetic: No     Tobacco smoker: No     Systolic Blood Pressure: 110 mmHg     Is BP treated: No  HDL Cholesterol: 83 mg/dL     Total Cholesterol: 209 mg/dL  Objective:  Physical Exam Musculoskeletal:        General: Tenderness (beneath left shoulder blade and anterior bursa space to right shoulder.) present.     Right shoulder: Tenderness (anterior bursa) present. No bony tenderness. Decreased range of motion (unable to internal rotatio).     Left shoulder: Crepitus present. No tenderness.         Assessment And Plan:  Chronic pain of both shoulders Assessment & Plan: Her right shoulder is worse now with limited range of motion mostly with internal rotation. She has crepitus to her left shoulder blade. Would like to go to [facility] with [employer] Physical therapist.   Orders: -     Ambulatory referral to Physical Therapy  Herpes zoster vaccination declined Assessment & Plan: Declines shingrix, educated on disease process  and is aware if he changes his mind to notify office    Class 1 obesity due to excess calories with body mass index (BMI) of 31.0 to 31.9 in adult, unspecified whether serious comorbidity present Assessment & Plan: She is encouraged to strive for BMI less than 30 to decrease cardiac risk. Advised to aim for at least 150 minutes of exercise per week.      Return for keep same next.  Patient was given opportunity to ask questions. Patient verbalized understanding of the plan and was able to repeat key elements of the plan. All questions were answered to their satisfaction.    Kristy Kristy Ada, FNP, have reviewed all documentation for this visit. The documentation on 03/06/23 for the exam, diagnosis, procedures, and orders are all accurate and complete.   IF YOU HAVE BEEN REFERRED TO A SPECIALIST, IT MAY TAKE 1-2 WEEKS TO SCHEDULE/PROCESS THE REFERRAL. IF YOU HAVE NOT HEARD FROM US /SPECIALIST IN TWO WEEKS, PLEASE GIVE US  A CALL AT [phone].

## 2023-05-04 ENCOUNTER — Ambulatory Visit: Payer: Self-pay | Admitting: Nurse Practitioner

## 2023-05-04 ENCOUNTER — Encounter: Payer: Self-pay | Admitting: Nurse Practitioner

## 2023-05-04 VITALS — BP 120/68 | HR 85 | Temp 98.5°F | Ht 68.0 in | Wt 207.6 lb

## 2023-05-04 DIAGNOSIS — Z6831 Body mass index (BMI) 31.0-31.9, adult: Secondary | ICD-10-CM

## 2023-05-04 DIAGNOSIS — Z0000 Encounter for general adult medical examination without abnormal findings: Secondary | ICD-10-CM

## 2023-05-04 DIAGNOSIS — D1720 Benign lipomatous neoplasm of skin and subcutaneous tissue of unspecified limb: Secondary | ICD-10-CM

## 2023-05-04 DIAGNOSIS — E6609 Other obesity due to excess calories: Secondary | ICD-10-CM

## 2023-05-04 DIAGNOSIS — Z79899 Other long term (current) drug therapy: Secondary | ICD-10-CM

## 2023-05-04 DIAGNOSIS — E559 Vitamin D deficiency, unspecified: Secondary | ICD-10-CM

## 2023-05-04 DIAGNOSIS — E1169 Type 2 diabetes mellitus with other specified complication: Secondary | ICD-10-CM | POA: Diagnosis not present

## 2023-05-04 DIAGNOSIS — E7800 Pure hypercholesterolemia, unspecified: Secondary | ICD-10-CM | POA: Diagnosis not present

## 2023-05-04 DIAGNOSIS — R7303 Prediabetes: Secondary | ICD-10-CM

## 2023-05-04 DIAGNOSIS — E66811 Obesity, class 1: Secondary | ICD-10-CM

## 2023-05-04 DIAGNOSIS — E669 Obesity, unspecified: Secondary | ICD-10-CM

## 2023-05-04 NOTE — Patient Instructions (Signed)
 Health Maintenance  Topic Date Due   Complete foot exam   Never done   Eye exam for diabetics  Never done   Yearly kidney health urinalysis for diabetes  Never done   Zoster (Shingles) Vaccine (1 of 2) 06/04/2023*   Hemoglobin A1C  05/07/2023   Yearly kidney function blood test for diabetes  11/07/2023   Mammogram  12/26/2023   Colon Cancer Screening  11/04/2026   DTaP/Tdap/Td vaccine (2 - Td or Tdap) 03/07/2028   Hepatitis C Screening  Completed   HIV Screening  Completed   Pneumococcal Vaccination  Aged Out   HPV Vaccine  Aged Out   Flu Shot  Discontinued   COVID-19 Vaccine  Discontinued  *Topic was postponed. The date shown is not the original due date.

## 2023-05-04 NOTE — Assessment & Plan Note (Signed)
 No significant changes. Last ultrasound was in 2022. Advised if worsens or causes pain we can repeat the ultrasound and/or send to surgeon for possible excision

## 2023-05-04 NOTE — Progress Notes (Signed)
 [name], CMA,acting as a Neurosurgeon for Arnette Felts, FNP.,have documented all relevant documentation on the behalf of Arnette Felts, FNP,as directed by  Arnette Felts, FNP while in the presence of Arnette Felts, FNP.  Subjective:    Patient ID: Kristy [name] , female    DOB: 1965-10-25 , 58 y.o.   MRN: [id number]  Chief Complaint  Patient presents with   Annual Exam    HPI  Patient presents today for HM, Patient reports compliance with medication. Self employed, still working with PT for frozen shoulder.  Walks occasionally for exercise, vibration board.  Advised to increase to 4 times per week at 40 minutes and to incorporate strength training.  Diet she limits carbs and tries to eat more proteins and limit sugars.  Patient denies any chest pain, SOB, or headaches. No issues with bowels or urine.  Patient has no concerns today. Patient reports a partial hysterectomy years ago, educated to have mammogram completed annually.       Past Medical History:  Diagnosis Date   Abdominal pain, periumbilical    Acute constipation    Anemia    Clotting disorder (HCC)    Pulmonary embolism (HCC) 2009     Family History  Problem Relation Age of Onset   Cancer Mother    Cancer Father      Current Outpatient Medications:    Multiple Vitamin (MULTIVITAMIN ADULT PO), Take by mouth. 1 tablespoon daily, Disp: , Rfl:    OVER THE COUNTER MEDICATION, Optimal V, Disp: , Rfl:    OVER THE COUNTER MEDICATION, Optimal M, Disp: , Rfl:    OVER THE COUNTER MEDICATION, Tahitian Noni Juice, Disp: , Rfl:    OVER THE COUNTER MEDICATION, Vinali, Disp: , Rfl:    OVER THE COUNTER MEDICATION, Omega Q, Disp: , Rfl:    OVER THE COUNTER MEDICATION, Maginacal D, Disp: , Rfl:    OVER THE COUNTER MEDICATION, Xceler8, Disp: , Rfl:    OVER THE COUNTER MEDICATION, Xceler8 Slenderiix, Disp: , Rfl:    No Known Allergies    The patient states she uses status post hysterectomy.  No LMP recorded. Patient has had  a hysterectomy.. Negative for Dysmenorrhea and Negative for Menorrhagia. Negative for: breast discharge, breast lump(s), breast pain and breast self exam. Associated symptoms include abnormal vaginal bleeding. Pertinent negatives include abnormal bleeding (hematology), anxiety, decreased libido, depression, difficulty falling sleep, dyspareunia, history of infertility, nocturia, sexual dysfunction, sleep disturbances, urinary incontinence, urinary urgency, vaginal discharge and vaginal itching. Diet regular; trying to limit carbs and tries to eat more proteins and limit sugars. The patient states her exercise level is minimal - 2 days a week.   The patient's tobacco use is:  Social History   Tobacco Use  Smoking Status Never  Smokeless Tobacco Never  . She has been exposed to passive smoke. The patient's alcohol use is:  Social History   Substance and Sexual Activity  Alcohol Use No    Review of Systems  Constitutional: Negative.   HENT: Negative.    Eyes: Negative.   Respiratory: Negative.    Cardiovascular: Negative.   Gastrointestinal: Negative.   Endocrine: Negative.   Genitourinary: Negative.   Musculoskeletal: Negative.        Shoulder pain  Skin: Negative.   Allergic/Immunologic: Negative.   Neurological: Negative.   Hematological: Negative.   Psychiatric/Behavioral: Negative.       Today's Vitals   05/04/23 0840  BP: 120/68  Pulse: 85  Temp: 98.5 F (  36.9 C)  TempSrc: Oral  Weight: 207 lb 9.6 oz (94.2 kg)  Height: 5\' 8"  (1.727 m)  PainSc: 0-No pain   Body mass index is 31.57 kg/m.  Wt Readings from Last 3 Encounters:  05/04/23 207 lb 9.6 oz (94.2 kg)  03/06/23 206 lb 9.6 oz (93.7 kg)  11/07/22 210 lb (95.3 kg)     Objective:  Physical Exam Vitals reviewed.  Constitutional:      General: She is not in acute distress.    Appearance: Normal appearance. She is well-developed. She is obese.  HENT:     Head: Normocephalic and atraumatic.     Right Ear:  Hearing, tympanic membrane, ear canal and external ear normal. There is no impacted cerumen.     Left Ear: Hearing, tympanic membrane, ear canal and external ear normal. There is no impacted cerumen.     Nose: Nose normal.     Mouth/Throat:     Mouth: Mucous membranes are moist.  Eyes:     General: Lids are normal.     Extraocular Movements: Extraocular movements intact.     Conjunctiva/sclera: Conjunctivae normal.     Pupils: Pupils are equal, round, and reactive to light.     Funduscopic exam:    Right eye: No papilledema.        Left eye: No papilledema.  Neck:     Thyroid: No thyroid mass.     Vascular: No carotid bruit.  Cardiovascular:     Rate and Rhythm: Normal rate and regular rhythm.     Pulses: Normal pulses.     Heart sounds: Normal heart sounds. No murmur heard. Pulmonary:     Effort: Pulmonary effort is normal. No respiratory distress.     Breath sounds: Normal breath sounds. No wheezing or rhonchi.  Abdominal:     General: Abdomen is flat. Bowel sounds are normal. There is no distension.     Palpations: Abdomen is soft.     Tenderness: There is no abdominal tenderness.  Musculoskeletal:        General: No swelling or tenderness.     Cervical back: Full passive range of motion without pain, normal range of motion and neck supple.     Comments: Decreased range of motion to left shoulder  Skin:    General: Skin is warm and dry.     Capillary Refill: Capillary refill takes less than 2 seconds.     Findings: No rash.  Neurological:     General: No focal deficit present.     Mental Status: She is alert and oriented to person, place, and time.     Cranial Nerves: No cranial nerve deficit.     Sensory: No sensory deficit.  Psychiatric:        Mood and Affect: Mood normal.        Behavior: Behavior normal.        Thought Content: Thought content normal.        Judgment: Judgment normal.     Assessment And Plan:     Encounter for annual health  examination Assessment & Plan: Behavior modifications discussed and diet history reviewed.   Pt will continue to exercise regularly and modify diet with low GI, plant based foods and decrease intake of processed foods.  Recommend intake of daily multivitamin, Vitamin D, and calcium.  Recommend mammogram and colonoscopy (up to date) for preventive screenings, as well as recommend immunizations that include influenza, TDAP, and Shingles (declines)   Orders: -  CMP14+EGFR  Prediabetes Assessment & Plan: HgbA1c remains slightly elevated. Explained if her A1c is over 7 we definitely need to strongly consider a medication.    Orders: -     Hemoglobin A1c  Elevated cholesterol Assessment & Plan: Cholesterol levels are increased at last visit. Continue to focus on low fat diet.   Orders: -     Lipid panel  Vitamin D deficiency Assessment & Plan: Will check vitamin D level and supplement as needed.   She is taking Calcium, magnesium and vitamin d Also encouraged to spend 15 minutes in the sun daily.    Orders: -     VITAMIN D 25 Hydroxy (Vit-D Deficiency, Fractures)  Lipoma of upper arm Assessment & Plan: No significant changes. Last ultrasound was in 2022. Advised if worsens or causes pain we can repeat the ultrasound and/or send to surgeon for possible excision   Class 1 obesity due to excess calories with body mass index (BMI) of 31.0 to 31.9 in adult, unspecified whether serious comorbidity present Assessment & Plan: She is encouraged to strive for BMI less than 30 to decrease cardiac risk. Advised to aim for at least 150 minutes of exercise per week.    Other long term (current) drug therapy -     CBC with Differential/Platelet  Type 2 diabetes mellitus with obesity (HCC) Assessment & Plan: HgbA1c remains slightly elevated. Explained if her A1c is over 7 we definitely need to strongly consider a medication.       Return for 1 year physical -, 6 month pre DM  check. Patient was given opportunity to ask questions. Patient verbalized understanding of the plan and was able to repeat key elements of the plan. All questions were answered to their satisfaction.   Arnette Felts, FNP  I, Arnette Felts, FNP, have reviewed all documentation for this visit. The documentation on 05/04/23 for the exam, diagnosis, procedures, and orders are all accurate and complete.

## 2023-05-04 NOTE — Assessment & Plan Note (Signed)
 She is encouraged to strive for BMI less than 30 to decrease cardiac risk. Advised to aim for at least 150 minutes of exercise per week.

## 2023-05-04 NOTE — Assessment & Plan Note (Signed)
 Will check vitamin D level and supplement as needed.   She is taking Calcium, magnesium and vitamin d Also encouraged to spend 15 minutes in the sun daily.

## 2023-05-04 NOTE — Assessment & Plan Note (Addendum)
 HgbA1c remains slightly elevated. Explained if her A1c is over 7 we definitely need to strongly consider a medication.

## 2023-05-04 NOTE — Assessment & Plan Note (Signed)
 Cholesterol levels are increased at last visit. Continue to focus on low fat diet.

## 2023-05-04 NOTE — Assessment & Plan Note (Signed)
 Behavior modifications discussed and diet history reviewed.   Pt will continue to exercise regularly and modify diet with low GI, plant based foods and decrease intake of processed foods.  Recommend intake of daily multivitamin, Vitamin D, and calcium.  Recommend mammogram and colonoscopy (up to date) for preventive screenings, as well as recommend immunizations that include influenza, TDAP, and Shingles (declines)

## 2023-05-05 LAB — CMP14+EGFR
ALT: 12 IU/L (ref 0–32)
AST: 14 IU/L (ref 0–40)
Albumin: 4.1 g/dL (ref 3.8–4.9)
Alkaline Phosphatase: 100 IU/L (ref 44–121)
BUN/Creatinine Ratio: 16 (ref 9–23)
BUN: 16 mg/dL (ref 6–24)
Bilirubin Total: 0.3 mg/dL (ref 0.0–1.2)
CO2: 24 mmol/L (ref 20–29)
Calcium: 9.4 mg/dL (ref 8.7–10.2)
Chloride: 102 mmol/L (ref 96–106)
Creatinine, Ser: 1.03 mg/dL — ABNORMAL HIGH (ref 0.57–1.00)
Globulin, Total: 3 g/dL (ref 1.5–4.5)
Glucose: 88 mg/dL (ref 70–99)
Potassium: 4.1 mmol/L (ref 3.5–5.2)
Sodium: 140 mmol/L (ref 134–144)
Total Protein: 7.1 g/dL (ref 6.0–8.5)
eGFR: 63 mL/min/{1.73_m2} (ref >59)

## 2023-05-05 LAB — CBC WITH DIFFERENTIAL/PLATELET
Basophils Absolute: 0 10*3/uL (ref 0.0–0.2)
Basos: 1 %
EOS (ABSOLUTE): 0.1 10*3/uL (ref 0.0–0.4)
Eos: 2 %
Hematocrit: 37.2 % (ref 34.0–46.6)
Hemoglobin: 11.6 g/dL (ref 11.1–15.9)
Immature Grans (Abs): 0 10*3/uL (ref 0.0–0.1)
Immature Granulocytes: 1 %
Lymphocytes Absolute: 1.9 10*3/uL (ref 0.7–3.1)
Lymphs: 33 %
MCH: 21.6 pg — ABNORMAL LOW (ref 26.6–33.0)
MCHC: 31.2 g/dL — ABNORMAL LOW (ref 31.5–35.7)
MCV: 69 fL — ABNORMAL LOW (ref 79–97)
Monocytes Absolute: 0.5 10*3/uL (ref 0.1–0.9)
Monocytes: 8 %
Neutrophils Absolute: 3.1 10*3/uL (ref 1.4–7.0)
Neutrophils: 55 %
Platelets: 255 10*3/uL (ref 150–450)
RBC: 5.37 x10E6/uL — ABNORMAL HIGH (ref 3.77–5.28)
RDW: 17.6 % — ABNORMAL HIGH (ref 11.7–15.4)
WBC: 5.6 10*3/uL (ref 3.4–10.8)

## 2023-05-05 LAB — LIPID PANEL
Chol/HDL Ratio: 2.5 ratio (ref 0.0–4.4)
Cholesterol, Total: 214 mg/dL — ABNORMAL HIGH (ref 100–199)
HDL: 85 mg/dL (ref >39)
LDL Chol Calc (NIH): 118 mg/dL — ABNORMAL HIGH (ref 0–99)
Triglycerides: 63 mg/dL (ref 0–149)
VLDL Cholesterol Cal: 11 mg/dL (ref 5–40)

## 2023-05-05 LAB — HEMOGLOBIN A1C
Est. average glucose Bld gHb Est-mCnc: 146 mg/dL
Hgb A1c MFr Bld: 6.7 % — ABNORMAL HIGH (ref 4.8–5.6)

## 2023-05-05 LAB — VITAMIN D 25 HYDROXY (VIT D DEFICIENCY, FRACTURES): Vit D, 25-Hydroxy: 33.4 ng/mL (ref 30.0–100.0)

## 2023-06-08 ENCOUNTER — Encounter: Payer: Self-pay | Admitting: Nurse Practitioner

## 2023-07-05 ENCOUNTER — Other Ambulatory Visit: Payer: Self-pay | Admitting: Nurse Practitioner

## 2023-07-05 DIAGNOSIS — E1169 Type 2 diabetes mellitus with other specified complication: Secondary | ICD-10-CM

## 2023-07-05 DIAGNOSIS — E782 Mixed hyperlipidemia: Secondary | ICD-10-CM

## 2023-07-05 MED ORDER — SIMVASTATIN 10 MG PO TABS: ORAL_TABLET | ORAL | 1 refills | Status: DC

## 2023-09-11 ENCOUNTER — Telehealth: Payer: Self-pay | Admitting: Pharmacist

## 2023-09-11 DIAGNOSIS — E669 Obesity, unspecified: Secondary | ICD-10-CM

## 2023-09-11 NOTE — Progress Notes (Signed)
   09/11/2023  Patient ID: Kristy [name], female   DOB: [birth year], 58 y.o.   MRN: [id number]  Patient was on the practice statin report because she has diabetes but was not on statin therapy. Per review of chart. Simvastatin  10 mg was started 1 tablet Monday, Wednesday, and Friday on 07/05/23 by [name], FNP.  Plan: Measure closed.   Kristy [name], PharmD, BCACP Clinical Pharmacist [phone]

## 2023-11-06 ENCOUNTER — Encounter: Payer: Self-pay | Admitting: Nurse Practitioner

## 2023-11-06 ENCOUNTER — Ambulatory Visit: Admitting: Nurse Practitioner

## 2023-11-06 VITALS — BP 120/80 | HR 60 | Temp 98.5°F | Ht 68.0 in | Wt 210.4 lb

## 2023-11-06 DIAGNOSIS — E782 Mixed hyperlipidemia: Secondary | ICD-10-CM

## 2023-11-06 DIAGNOSIS — D649 Anemia, unspecified: Secondary | ICD-10-CM

## 2023-11-06 DIAGNOSIS — Z139 Encounter for screening, unspecified: Secondary | ICD-10-CM

## 2023-11-06 DIAGNOSIS — Z2821 Immunization not carried out because of patient refusal: Secondary | ICD-10-CM

## 2023-11-06 DIAGNOSIS — E66811 Obesity, class 1: Secondary | ICD-10-CM

## 2023-11-06 DIAGNOSIS — E1169 Type 2 diabetes mellitus with other specified complication: Secondary | ICD-10-CM | POA: Diagnosis not present

## 2023-11-06 DIAGNOSIS — E669 Obesity, unspecified: Secondary | ICD-10-CM

## 2023-11-06 DIAGNOSIS — M7502 Adhesive capsulitis of left shoulder: Secondary | ICD-10-CM

## 2023-11-06 DIAGNOSIS — E6609 Other obesity due to excess calories: Secondary | ICD-10-CM

## 2023-11-06 DIAGNOSIS — Z6831 Body mass index (BMI) 31.0-31.9, adult: Secondary | ICD-10-CM

## 2023-11-06 NOTE — Progress Notes (Signed)
 [name], CMA,acting as a Neurosurgeon for [name], FNP.,have documented all relevant documentation on the behalf of [name], FNP,as directed by  [name], FNP while in the presence of [name], FNP.  Subjective:  Patient ID: Kristy [name] , female    DOB: [birth year] , 58 y.o.   MRN: [id number]  Chief Complaint  Patient presents with   Diabetes    Patient presents today for a CHOL and dm follow up, Patient reports compliance with medication. Patient denies any chest pain, SOB, or headaches. Patient has no concerns today.      HPI  Discussed the use of AI scribe software for clinical note transcription with the patient, who gave verbal consent to proceed.  History of Present Illness Kristy [name] is a 58 year old female with diabetes who presents for a follow-up visit.  She reports that she is not currently taking any prescription medications for diabetes and is only taking simvastatin , which she takes on Monday, Wednesday, and Friday. Her last hemoglobin A1c was 6.7, and she does not regularly monitor her blood glucose levels at home. She has been taking supplements for low iron levels but feels they are ineffective. She is postmenopausal.  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  She has not had a recent diabetic eye exam and is unsure of the last visit to her eye doctor, Dr. Evalene Eden at [facility], [place].  She exercises about two days a week and is considering increasing this to three days. She has been working with a physical therapist, Royce Derrek Moats, who has relocated, and she is attempting to continue therapy to improve muscle strength and range of motion, particularly to address joint clicking.   Past Medical History:  Diagnosis Date   Abdominal pain, periumbilical    Acute constipation    Anemia    Clotting disorder (HCC)    Pulmonary embolism (HCC) 2009     Family  History  Problem Relation Age of Onset   Cancer Mother    Cancer Father      Current Outpatient Medications:    Multiple Vitamin (MULTIVITAMIN ADULT PO), Take by mouth. 1 tablespoon daily, Disp: , Rfl:    OVER THE COUNTER MEDICATION, Optimal V, Disp: , Rfl:    OVER THE COUNTER MEDICATION, Optimal M, Disp: , Rfl:    OVER THE COUNTER MEDICATION, Tahitian Noni Juice, Disp: , Rfl:    OVER THE COUNTER MEDICATION, Vinali, Disp: , Rfl:    OVER THE COUNTER MEDICATION, Omega Q, Disp: , Rfl:    OVER THE COUNTER MEDICATION, Maginacal D, Disp: , Rfl:    OVER THE COUNTER MEDICATION, Xceler8, Disp: , Rfl:    OVER THE COUNTER MEDICATION, Xceler8 Slenderiix, Disp: , Rfl:    simvastatin  (ZOCOR ) 10 MG tablet, Take 1 tablet by mouth MWF, Disp: 45 tablet, Rfl: 1   No Known Allergies   Review of Systems  Constitutional: Negative.   HENT: Negative.    Eyes: Negative.   Respiratory: Negative.    Cardiovascular: Negative.   Gastrointestinal: Negative.   Genitourinary: Negative.   Musculoskeletal:        Shoulder pain and clicking  Neurological: Negative.   Psychiatric/Behavioral: Negative.       Today's Vitals   11/06/23 0946  BP: 120/80  Pulse: 60  Temp: 98.5 F (36.9 C)  TempSrc: Oral  Weight: 210 lb 6.4 oz (95.4 kg)  Height: 5' 8 (1.727 m)  PainSc: 0-No pain   Body mass index is 31.99 kg/m.  Wt Readings from Last 3 Encounters:  11/06/23 210 lb 6.4 oz (95.4 kg)  05/04/23 207 lb 9.6 oz (94.2 kg)  03/06/23 206 lb 9.6 oz (93.7 kg)     Objective:  Physical Exam Vitals and nursing note reviewed.  Constitutional:      General: She is not in acute distress.    Appearance: Normal appearance. She is well-developed. She is obese.  Cardiovascular:     Rate and Rhythm: Normal rate and regular rhythm.     Pulses: Normal pulses.     Heart sounds: Normal heart sounds. No murmur heard. Pulmonary:     Effort: Pulmonary effort is normal. No respiratory distress.     Breath sounds: Normal  breath sounds.  Chest:     Chest wall: No tenderness.  Musculoskeletal:        General: No tenderness.  Skin:    General: Skin is warm and dry.     Capillary Refill: Capillary refill takes less than 2 seconds.  Neurological:     General: No focal deficit present.     Mental Status: She is alert and oriented to person, place, and time.  Psychiatric:        Mood and Affect: Mood normal.        Behavior: Behavior normal.        Thought Content: Thought content normal.        Judgment: Judgment normal.      Assessment And Plan:  Type 2 diabetes mellitus with obesity (HCC) Assessment & Plan: A1c at 6.7 indicates suboptimal control. No current diabetes medications. Supplements ineffective and continues to decline prescription medications.  No home glucose monitoring. Increased follow-up needed. - Order hemoglobin A1c test. - Schedule diabetic eye exam before year-end. - Encourage exercise three days a week. - Schedule follow-up in three months.  Orders: -     Hemoglobin A1c; Future -     Lipid panel; Future -     Microalbumin / creatinine urine ratio  Mixed hyperlipidemia Assessment & Plan: Managed with simvastatin  on Monday, Wednesday, and Friday. Lipid panel needed to assess cholesterol levels. Fasting required for accuracy. - Order lipid panel. - Advise fasting 6-8 hours before lipid panel.  Orders: -     CMP14+EGFR; Future  Herpes zoster vaccination declined  Class 1 obesity due to excess calories with body mass index (BMI) of 31.0 to 31.9 in adult, unspecified whether serious comorbidity present  Encounter for screening -     Hepatitis B surface antibody,qualitative; Future  Anemia, unspecified type -     Iron, TIBC and Ferritin Panel; Future  Adhesive capsulitis of left shoulder Assessment & Plan: Improved range of motion. Building muscle to reduce clicking and improve function. Seeking continued physical therapy with specific therapist. - Assist in locating  preferred physical therapist if needed. - she will let us  know where the therapist is located     Return for 3 month DM f/u .  Patient was given opportunity to ask questions. Patient verbalized understanding of the plan and was able to repeat key elements of the plan. All questions were answered to their satisfaction.    LILLETTE [name], FNP, have reviewed all documentation for this visit. The documentation on 11/06/23 for the exam, diagnosis, procedures, and orders are all accurate and complete.    IF YOU HAVE BEEN REFERRED TO A SPECIALIST, IT MAY TAKE 1-2 WEEKS TO SCHEDULE/PROCESS THE REFERRAL. IF  YOU HAVE NOT HEARD FROM US /SPECIALIST IN TWO WEEKS, PLEASE GIVE US  A CALL AT [phone].

## 2023-11-07 ENCOUNTER — Other Ambulatory Visit

## 2023-11-07 DIAGNOSIS — E782 Mixed hyperlipidemia: Secondary | ICD-10-CM

## 2023-11-07 DIAGNOSIS — E1169 Type 2 diabetes mellitus with other specified complication: Secondary | ICD-10-CM

## 2023-11-07 DIAGNOSIS — Z139 Encounter for screening, unspecified: Secondary | ICD-10-CM

## 2023-11-07 DIAGNOSIS — D649 Anemia, unspecified: Secondary | ICD-10-CM

## 2023-11-07 LAB — MICROALBUMIN / CREATININE URINE RATIO
Creatinine, Urine: 105.5 mg/dL
Microalb/Creat Ratio: 4 mg/g{creat} (ref 0–29)
Microalbumin, Urine: 4.1 ug/mL

## 2023-11-08 LAB — LIPID PANEL
Chol/HDL Ratio: 2.6 ratio (ref 0.0–4.4)
Cholesterol, Total: 204 mg/dL — ABNORMAL HIGH (ref 100–199)
HDL: 79 mg/dL (ref >39)
LDL Chol Calc (NIH): 114 mg/dL — ABNORMAL HIGH (ref 0–99)
Triglycerides: 62 mg/dL (ref 0–149)
VLDL Cholesterol Cal: 11 mg/dL (ref 5–40)

## 2023-11-08 LAB — CMP14+EGFR
ALT: 7 IU/L (ref 0–32)
AST: 13 IU/L (ref 0–40)
Albumin: 4.1 g/dL (ref 3.8–4.9)
Alkaline Phosphatase: 89 IU/L (ref 44–121)
BUN/Creatinine Ratio: 14 (ref 9–23)
BUN: 15 mg/dL (ref 6–24)
Bilirubin Total: 0.3 mg/dL (ref 0.0–1.2)
CO2: 23 mmol/L (ref 20–29)
Calcium: 9.4 mg/dL (ref 8.7–10.2)
Chloride: 104 mmol/L (ref 96–106)
Creatinine, Ser: 1.07 mg/dL — ABNORMAL HIGH (ref 0.57–1.00)
Globulin, Total: 2.8 g/dL (ref 1.5–4.5)
Glucose: 93 mg/dL (ref 70–99)
Potassium: 4.6 mmol/L (ref 3.5–5.2)
Sodium: 140 mmol/L (ref 134–144)
Total Protein: 6.9 g/dL (ref 6.0–8.5)
eGFR: 60 mL/min/1.73 (ref >59)

## 2023-11-08 LAB — IRON AND TIBC
Iron Saturation: 15 % (ref 15–55)
Iron: 45 ug/dL (ref 27–159)
Total Iron Binding Capacity: 292 ug/dL (ref 250–450)
UIBC: 247 ug/dL (ref 131–425)

## 2023-11-08 LAB — HEPATITIS B SURFACE ANTIBODY,QUALITATIVE: Hep B Surface Ab, Qual: NONREACTIVE

## 2023-11-08 LAB — FERRITIN: Ferritin: 109 ng/mL (ref 15–150)

## 2023-11-08 LAB — HEMOGLOBIN A1C
Est. average glucose Bld gHb Est-mCnc: 140 mg/dL
Hgb A1c MFr Bld: 6.5 % — ABNORMAL HIGH (ref 4.8–5.6)

## 2023-11-14 ENCOUNTER — Ambulatory Visit: Payer: Self-pay | Admitting: Nurse Practitioner

## 2023-11-14 DIAGNOSIS — E782 Mixed hyperlipidemia: Secondary | ICD-10-CM | POA: Insufficient documentation

## 2023-11-14 NOTE — Assessment & Plan Note (Signed)
 Managed with simvastatin  on Monday, Wednesday, and Friday. Lipid panel needed to assess cholesterol levels. Fasting required for accuracy. - Order lipid panel. - Advise fasting 6-8 hours before lipid panel.

## 2023-11-14 NOTE — Assessment & Plan Note (Signed)
 Improved range of motion. Building muscle to reduce clicking and improve function. Seeking continued physical therapy with specific therapist. - Assist in locating preferred physical therapist if needed. - she will let us  know where the therapist is located

## 2023-11-14 NOTE — Assessment & Plan Note (Signed)
 A1c at 6.7 indicates suboptimal control. No current diabetes medications. Supplements ineffective and continues to decline prescription medications.  No home glucose monitoring. Increased follow-up needed. - Order hemoglobin A1c test. - Schedule diabetic eye exam before year-end. - Encourage exercise three days a week. - Schedule follow-up in three months.

## 2023-12-04 ENCOUNTER — Ambulatory Visit: Admitting: Dermatology

## 2023-12-04 ENCOUNTER — Encounter: Payer: Self-pay | Admitting: Dermatology

## 2023-12-04 VITALS — BP 138/87 | HR 65

## 2023-12-04 DIAGNOSIS — D239 Other benign neoplasm of skin, unspecified: Secondary | ICD-10-CM | POA: Diagnosis not present

## 2023-12-04 DIAGNOSIS — L918 Other hypertrophic disorders of the skin: Secondary | ICD-10-CM

## 2023-12-04 DIAGNOSIS — E88819 Insulin resistance, unspecified: Secondary | ICD-10-CM

## 2023-12-04 DIAGNOSIS — E669 Obesity, unspecified: Secondary | ICD-10-CM

## 2023-12-04 DIAGNOSIS — L821 Other seborrheic keratosis: Secondary | ICD-10-CM | POA: Diagnosis not present

## 2023-12-04 NOTE — Patient Instructions (Addendum)
 VISIT SUMMARY:  During your visit, we discussed your concerns about skin tags and other skin growths. We reviewed your history of having a growth removed and examined your current skin tags, seborrheic keratosis, and dermatofibroma. We also talked about the relationship between insulin resistance and skin tags, and the importance of managing your prediabetes.  YOUR PLAN:  -SKIN TAGS:  Skin tags are small, benign skin growths that can be related to insulin resistance and prediabetes. They are not harmful but can be removed for cosmetic reasons.  Removal involves cutting, which may leave dark spots that can be treated with lightening creams. We will determine the number of skin tags to be removed and schedule a procedure.  You will need to apply numbing cream before removal, and we will use aluminum chloride at the base post-removal. Lightening creams will be provided for any dark spots that develop.  We also discussed referring you to [facility] for weight management and prediabetes control.  -PREDIABETES:  Prediabetes is a condition where your blood sugar levels are higher than normal but not high enough to be classified as diabetes. It can contribute to the development of skin tags. Controlling your blood sugar is crucial to prevent new skin tags from forming. We will send a referral to [facility] for weight management and prediabetes control.  -SEBORRHEIC KERATOSIS:  Seborrheic keratosis are benign age spots that do not have any potential to become cancerous. They do not need to be removed unless they become itchy or bothersome. If removal is desired, it can be done by freezing, which may be painful and cause crusting.  -DERMATOFIBROMA:  A dermatofibroma is a benign ball of scar tissue that usually results from a previous skin injury. It is firm, dark brown, and has a dimpled appearance. Removal is not recommended as it may result in a larger  lesion.  INSTRUCTIONS:  Please determine the number of skin tags you would like to have removed and schedule a procedure. We will send a referral to [facility] for weight management and prediabetes control.  Other Procedure(Skin Tags): Quote for cosmetic removal:  $200 to remove Up to 15 lesions $300 to remove  16 to 25 $400 to removal  26-35 $10 per tag for each additional     Important Information  Due to recent changes in healthcare laws, you may see results of your pathology and/or laboratory studies on MyChart before the doctors have had a chance to review them. We understand that in some cases there may be results that are confusing or concerning to you. Please understand that not all results are received at the same time and often the doctors may need to interpret multiple results in order to provide you with the best plan of care or course of treatment. Therefore, we ask that you please give us  2 business days to thoroughly review all your results before contacting the office for clarification. Should we see a critical lab result, you will be contacted sooner.   If You Need Anything After Your Visit  If you have any questions or concerns for your doctor, please call our main line at [phone] If no one answers, please leave a voicemail as directed and we will return your call as soon as possible. Messages left after 4 pm will be answered the following business day.   You may also send us  a message via MyChart. We typically respond to MyChart messages within 1-2 business  days.  For prescription refills, please ask your pharmacy to contact our office. Our fax number is [phone].  If you have an urgent issue when the clinic is closed that cannot wait until the next business day, you can page your doctor at the number below.    Please note that while we do our best to be available for urgent issues outside of office hours, we are not available 24/7.    If you have an urgent issue and are unable to reach us , you may choose to seek medical care at your doctor's office, retail clinic, urgent care center, or emergency room.  If you have a medical emergency, please immediately call 911 or go to the emergency department. In the event of inclement weather, please call our main line at [phone] for an update on the status of any delays or closures.  Dermatology Medication Tips: Please keep the boxes that topical medications come in in order to help keep track of the instructions about where and how to use these. Pharmacies typically print the medication instructions only on the boxes and not directly on the medication tubes.   If your medication is too expensive, please contact our office at [phone] or send us  a message through MyChart.   We are unable to tell what your co-pay for medications will be in advance as this is different depending on your insurance coverage. However, we may be able to find a substitute medication at lower cost or fill out paperwork to get insurance to cover a needed medication.   If a prior authorization is required to get your medication covered by your insurance company, please allow us  1-2 business days to complete this process.  Drug prices often vary depending on where the prescription is filled and some pharmacies may offer cheaper prices.  The website www.goodrx.com contains coupons for medications through different pharmacies. The prices here do not account for what the cost may be with help from insurance (it may be cheaper with your insurance), but the website can give you the price if you did not use any insurance.  - You can print the associated coupon and take it with your prescription to the pharmacy.  - You may also stop by our office during regular business hours and pick up a GoodRx coupon card.  - If you need your prescription sent electronically to a different pharmacy, notify our office  through [facility] or by phone at [phone]

## 2023-12-04 NOTE — Progress Notes (Signed)
   New Patient Visit   Subjective  Kristy [name] is a 58 y.o. female who presents for the following: skin tags  Patient states she has skin tags located at the neck, abdomen and back that she would like to have examined. Patient reports the areas have been there for 3 years. She reports the areas are bothersome on the neck when wearing certain clothing. She states that the areas have not spread. Patient reports she has not previously been treated for these areas. Patient reports having an accessory nipple removed years ago on her abdomen. Patient denies any other Hx of bx other than previous excision of accessory nipple. Patient's father has a history of skin cancer(s) but patient does not recall diagnosis.  The patient has spots, moles and lesions to be evaluated, some may be new or changing and the patient may have concern these could be cancer.  The following portions of the chart were reviewed this encounter and updated as appropriate: medications, allergies, medical history  Review of Systems:  No other skin or systemic complaints except as noted in HPI or Assessment and Plan.  Objective  Well appearing patient in no apparent distress; mood and affect are within normal limits.  A focused examination was performed of the following areas: Neck, abdomen and back  Relevant exam findings are noted in the Assessment and Plan.              Assessment & Plan   Skin tags Multiple skin tags likely related to insulin resistance and prediabetes. She is benign with no malignant potential. Removal is cosmetic and not covered by insurance. Removal involves cutting, which may leave hyperpigmented areas treatable with lightening creams. Removed tags do not return, but new ones may form if blood sugar is not controlled.  - Determine the number of skin tags to be removed and schedule a procedure. - Apply numbing cream before removal. - Use aluminum chloride at the base post-removal. -  Provide lightening creams for hyperpigmented areas post-removal. - Discuss referral to [facility] for weight management and prediabetes control.  Insulin Resistance / Prediabetes Prediabetes contributes to the development of skin tags. Controlling blood sugar is crucial to prevent new skin tags. Weight management is essential in controlling prediabetes. - Send referral to [facility] for weight management and prediabetes control.  SEBORRHEIC KERATOSIS - Stuck-on, waxy, tan-brown papules and/or plaques  - Benign-appearing - Discussed benign etiology and prognosis. - Observe - Call for any changes  DERMATOFIBROMA Exam: Firm pink/brown papulenodule with dimple sign.  Treatment Plan: A dermatofibroma is a benign growth possibly related to trauma, such as an insect bite, cut from shaving, or inflamed acne-type bump.  Treatment options to remove include shave or excision with resulting scar and risk of recurrence.  Since benign-appearing and not bothersome, will observe for now.     Return Patient will call to book appointment, for Follow up and Skin tag removal.  I, Lyle Cords, am acting as scribe for [employer], DO.   Documentation: I have reviewed the above documentation for accuracy and completeness, and I agree with the above.  Delon Lenis, DO

## 2023-12-11 ENCOUNTER — Ambulatory Visit: Admitting: Dermatology

## 2023-12-11 ENCOUNTER — Institutional Professional Consult (permissible substitution): Admitting: Bariatrics

## 2024-01-15 ENCOUNTER — Ambulatory Visit: Admitting: Bariatrics

## 2024-01-15 ENCOUNTER — Encounter: Payer: Self-pay | Admitting: Bariatrics

## 2024-01-15 VITALS — BP 109/69 | HR 63 | Ht 67.5 in | Wt 211.0 lb

## 2024-01-15 DIAGNOSIS — R7303 Prediabetes: Secondary | ICD-10-CM

## 2024-01-15 DIAGNOSIS — E66811 Obesity, class 1: Secondary | ICD-10-CM

## 2024-01-15 DIAGNOSIS — E669 Obesity, unspecified: Secondary | ICD-10-CM | POA: Diagnosis not present

## 2024-01-15 DIAGNOSIS — Z6832 Body mass index (BMI) 32.0-32.9, adult: Secondary | ICD-10-CM | POA: Diagnosis not present

## 2024-01-15 NOTE — Progress Notes (Signed)
 Office: [phone]  /  Fax: [phone]   Initial Visit  Kristy [name] was seen in clinic today to evaluate for obesity. She is interested in losing weight to improve overall health and reduce the risk of weight related complications. She presents today to review program treatment options, initial physical assessment, and evaluation.     She was referred by: PCP  When asked what else they would like to accomplish? She states: Adopt a healthier eating pattern and lifestyle, Improve energy levels and physical activity, Improve existing medical conditions, and Improve quality of life  When asked how has your weight affected you? She states: Contributed to medical problems  Some associated conditions: Prediabetes and Vitamin D  Deficiency  Contributing factors: family history of obesity, chronic skipping of meals, slow metabolism for age, and sedentary job  Weight promoting medications identified: None  Current nutrition plan: Low-carb and Portion control / smart choices  Current level of physical activity: walking  Current or previous pharmacotherapy: None  Response to medication: Never tried medications   Past medical history includes:   Past Medical History:  Diagnosis Date   Abdominal pain, periumbilical    Acute constipation    Anemia    Clotting disorder    Pulmonary embolism (HCC) 2009     Objective:   BP 109/69   Pulse 63   Ht 5' 7.5 (1.715 m)   Wt 211 lb (95.7 kg)   SpO2 99%   BMI 32.56 kg/m  She was weighed on the bioimpedance scale: Body mass index is 32.56 kg/m.  Peak Weight: 214 lbs , Body Fat%:41.1 %, Visceral Fat Rating:11, Weight trend over the last 12 months: fluctuating   General:  Alert, oriented and cooperative. Patient is in no acute distress.  Respiratory: Normal respiratory effort, no problems with respiration noted  Extremities: Normal range of motion.    Mental Status: Normal mood and affect. Normal behavior. Normal judgment and  thought content.   DIAGNOSTIC DATA REVIEWED:  BMET    Component Value Date/Time   NA 140 11/07/2023 1041   NA 140 02/11/2009 1515   K 4.6 11/07/2023 1041   K 3.7 02/11/2009 1515   CL 104 11/07/2023 1041   CL 105 02/11/2009 1515   CO2 23 11/07/2023 1041   CO2 29 02/11/2009 1515   GLUCOSE 93 11/07/2023 1041   GLUCOSE 86 11/09/2011 1623   GLUCOSE 105 02/11/2009 1515   BUN 15 11/07/2023 1041   BUN 11 02/11/2009 1515   CREATININE 1.07 (H) 11/07/2023 1041   CREATININE 0.9 02/11/2009 1515   CALCIUM 9.4 11/07/2023 1041   CALCIUM 9.0 02/11/2009 1515   GFRNONAA 67 04/23/2020 1409   GFRAA 77 04/23/2020 1409   Lab Results  Component Value Date   HGBA1C 6.5 (H) 11/07/2023   HGBA1C  03/10/2009    5.5 (NOTE) The ADA recommends the following therapeutic goal for glycemic control related to Hgb A1c measurement: Goal of therapy: <6.5 Hgb A1c  Reference: American Diabetes Association: Clinical Practice Recommendations 2010, Diabetes Care, 2010, 33: (Suppl  1).   No results found for: INSULIN CBC    Component Value Date/Time   WBC 5.6 05/04/2023 0926   WBC 7.3 11/09/2011 1615   RBC 5.37 (H) 05/04/2023 0926   RBC 5.29 (H) 11/09/2011 1615   HGB 11.6 05/04/2023 0926   HGB 10.9 (L) 03/26/2009 0937   HCT 37.2 05/04/2023 0926   HCT 33.4 (L) 03/26/2009 0937   PLT 255 05/04/2023 0926   MCV 69 (L)  05/04/2023 0926   MCV 76 (L) 03/26/2009 0937   MCH 21.6 (L) 05/04/2023 0926   MCH 22.3 (L) 11/09/2011 1615   MCHC 31.2 (L) 05/04/2023 0926   MCHC 31.7 11/09/2011 1615   RDW 17.6 (H) 05/04/2023 0926   RDW 17.9 (H) 03/26/2009 0937   Iron/TIBC/Ferritin/ %Sat    Component Value Date/Time   IRON 45 11/07/2023 1046   TIBC 292 11/07/2023 1046   FERRITIN 109 11/07/2023 1046   IRONPCTSAT 15 11/07/2023 1046   IRONPCTSAT 14 (L) 03/26/2009 0937   Lipid Panel     Component Value Date/Time   CHOL 204 (H) 11/07/2023 1041   TRIG 62 11/07/2023 1041   HDL 79 11/07/2023 1041   CHOLHDL 2.6  11/07/2023 1041   LDLCALC 114 (H) 11/07/2023 1041   Hepatic Function Panel     Component Value Date/Time   PROT 6.9 11/07/2023 1041   PROT 7.0 02/11/2009 1515   ALBUMIN 4.1 11/07/2023 1041   AST 13 11/07/2023 1041   AST 21 02/11/2009 1515   ALT 7 11/07/2023 1041   ALT 17 02/11/2009 1515   ALKPHOS 89 11/07/2023 1041   ALKPHOS 68 02/11/2009 1515   BILITOT 0.3 11/07/2023 1041   BILITOT 0.40 02/11/2009 1515   No results found for: TSH   Assessment and Plan:  Prediabetes Last A1c was 6.5. She states that she is prediabetic, but according to her lab work, she is in the diabetic range.  Medication(s): no medications.  Lab Results  Component Value Date   HGBA1C 6.5 (H) 11/07/2023   HGBA1C 6.7 (H) 05/04/2023   HGBA1C 6.6 (H) 11/07/2022   HGBA1C 6.6 (H) 05/03/2022   HGBA1C 6.4 (H) 04/29/2021   No results found for: INSULIN  Plan: Will begin the plan and exercise.    Generalized Obesity: Current BMI 32.56    Obesity Treatment / Action Plan:  Patient will work on garnering support from family and friends to begin weight loss journey. Will work on eliminating or reducing the presence of highly palatable, calorie dense foods in the home. Will complete provided nutritional and psychosocial assessment questionnaire before the next appointment. Will be scheduled for indirect calorimetry to determine resting energy expenditure in a fasting state.  This will allow us  to create a reduced calorie, high-protein meal plan to promote loss of fat mass while preserving muscle mass. Counseled on the health benefits of losing 5%-15% of total body weight. Was counseled on nutritional approaches to weight loss and benefits of reducing processed foods and consuming plant-based foods and high quality protein as part of nutritional weight management. Was counseled on pharmacotherapy and role as an adjunct in weight management.   Obesity Education Performed Today:  She was weighed on the  bioimpedance scale and results were discussed and documented in the synopsis.  We discussed obesity as a disease and the importance of a more detailed evaluation of all the factors contributing to the disease.  We discussed the importance of long term lifestyle changes which include nutrition, exercise and behavioral modifications as well as the importance of customizing this to her specific health and social needs.  We discussed the benefits of reaching a healthier weight to alleviate the symptoms of existing conditions and reduce the risks of the biomechanical, metabolic and psychological effects of obesity.  Discussed New Patient/Late Arrival, and Cancellation Policies. Patient voiced understanding and allowed to ask questions.   Kristy [name] appears to be in the action stage of change and states they are ready  to start intensive lifestyle modifications and behavioral modifications.  30 minutes was spent today on this visit including the above counseling, pre-visit chart review, and post-visit documentation.  Reviewed by clinician on day of visit: allergies, medications, problem list, medical history, surgical history, family history, social history, and previous encounter notes.    Bunnie Rehberg A. Delores CORDOBAO.

## 2024-02-12 ENCOUNTER — Ambulatory Visit: Payer: Self-pay | Admitting: Nurse Practitioner

## 2024-02-12 ENCOUNTER — Encounter: Payer: Self-pay | Admitting: Nurse Practitioner

## 2024-02-12 VITALS — BP 126/70 | HR 66 | Temp 97.9°F | Ht 67.0 in | Wt 207.0 lb

## 2024-02-12 DIAGNOSIS — Z139 Encounter for screening, unspecified: Secondary | ICD-10-CM | POA: Diagnosis not present

## 2024-02-12 DIAGNOSIS — E6609 Other obesity due to excess calories: Secondary | ICD-10-CM | POA: Diagnosis not present

## 2024-02-12 DIAGNOSIS — Z2821 Immunization not carried out because of patient refusal: Secondary | ICD-10-CM

## 2024-02-12 DIAGNOSIS — E1169 Type 2 diabetes mellitus with other specified complication: Secondary | ICD-10-CM | POA: Diagnosis not present

## 2024-02-12 DIAGNOSIS — E66811 Obesity, class 1: Secondary | ICD-10-CM | POA: Diagnosis not present

## 2024-02-12 DIAGNOSIS — E669 Obesity, unspecified: Secondary | ICD-10-CM

## 2024-02-12 DIAGNOSIS — Z6832 Body mass index (BMI) 32.0-32.9, adult: Secondary | ICD-10-CM

## 2024-02-12 DIAGNOSIS — E782 Mixed hyperlipidemia: Secondary | ICD-10-CM

## 2024-02-12 DIAGNOSIS — H9192 Unspecified hearing loss, left ear: Secondary | ICD-10-CM | POA: Diagnosis not present

## 2024-02-12 NOTE — Progress Notes (Unsigned)
 [name], CMA,acting as a neurosurgeon for Kristy Ada, FNP.,have documented all relevant documentation on the behalf of Kristy Ada, FNP,as directed by  Kristy Ada, FNP while in the presence of Kristy Ada, FNP.  Subjective:  Patient ID: Kristy [name] , female    DOB: [birth year] , 58 y.o.   MRN: [id number]  Chief Complaint  Patient presents with   Diabetes    Patient presents today for a chol and dm follow up, Patient reports compliance with medication. Patient denies any chest pain, SOB, or headaches.    Ear Fullness    Patient reports she has a full feeling in her left ear, she reports it is a little tender. She reports when she tries to clean her ears she feels as if she can't go as deep in her left ear.    Diabetes She presents for her follow-up diabetic visit. She has type 2 diabetes mellitus. There are no diabetic associated symptoms. Pertinent negatives for diabetes include no blurred vision, no fatigue, no foot ulcerations, no polydipsia, no polyphagia and no polyuria. There are no diabetic complications.    Discussed the use of AI scribe software for clinical note transcription with the patient, who gave verbal consent to proceed.  History of Present Illness   Past Medical History:  Diagnosis Date   Abdominal pain, periumbilical    Acute constipation    Anemia    Clotting disorder    Pulmonary embolism (HCC) 2009     Family History  Problem Relation Age of Onset   Cancer Mother    Cancer Father     Current Medications[1]   Allergies[2]   Review of Systems  Constitutional:  Negative for fatigue.  Eyes:  Negative for blurred vision.  Respiratory: Negative.    Cardiovascular: Negative.   Endocrine: Negative for polydipsia, polyphagia and polyuria.  Neurological: Negative.   Psychiatric/Behavioral: Negative.       Today's Vitals   02/12/24 1446  BP: 126/70  Pulse: 66  Temp: 97.9 F (36.6 C)  TempSrc: Oral  Weight: 207 lb (93.9 kg)   Height: 5' 7 (1.702 m)  PainSc: 0-No pain   Body mass index is 32.42 kg/m.  Wt Readings from Last 3 Encounters:  02/12/24 207 lb (93.9 kg)  01/15/24 211 lb (95.7 kg)  11/06/23 210 lb 6.4 oz (95.4 kg)     Objective:  Physical Exam Vitals and nursing note reviewed.  Constitutional:      General: She is not in acute distress.    Appearance: Normal appearance. She is well-developed. She is obese.  Cardiovascular:     Rate and Rhythm: Normal rate and regular rhythm.     Pulses: Normal pulses.     Heart sounds: Normal heart sounds. No murmur heard. Pulmonary:     Effort: Pulmonary effort is normal. No respiratory distress.     Breath sounds: Normal breath sounds.  Chest:     Chest wall: No tenderness.  Musculoskeletal:        General: No tenderness.  Skin:    General: Skin is warm and dry.     Capillary Refill: Capillary refill takes less than 2 seconds.  Neurological:     General: No focal deficit present.     Mental Status: She is alert and oriented to person, place, and time.  Psychiatric:        Mood and Affect: Mood normal.        Behavior: Behavior normal.  Thought Content: Thought content normal.        Judgment: Judgment normal.      {Perform Simple Foot Exam  Perform Detailed exam:1} {Insert foot Exam (Optional):30965}  Assessment And Plan:   Assessment & Plan Mixed hyperlipidemia  Type 2 diabetes mellitus in patient with obesity (HCC)  Herpes zoster vaccination declined  Class 1 obesity due to excess calories with body mass index (BMI) of 32.0 to 32.9 in adult, unspecified whether serious comorbidity present  Encounter for screening   Orders Placed This Encounter  Procedures   Lipid panel   Hemoglobin A1c   Hepatitis B Surface Antibody   Assessment & Plan    Return for keep same next.  Patient was given opportunity to ask questions. Patient verbalized understanding of the plan and was able to repeat key elements of the plan. All  questions were answered to their satisfaction.    [name], FNP, have reviewed all documentation for this visit. The documentation on 02/12/2024 for the exam, diagnosis, procedures, and orders are all accurate and complete.   IF YOU HAVE BEEN REFERRED TO A SPECIALIST, IT MAY TAKE 1-2 WEEKS TO SCHEDULE/PROCESS THE REFERRAL. IF YOU HAVE NOT HEARD FROM US /SPECIALIST IN TWO WEEKS, PLEASE GIVE US  A CALL AT [phone].       [1]  Current Outpatient Medications:    OVER THE COUNTER MEDICATION, Optimal V (Patient not taking: Reported on 02/12/2024), Disp: , Rfl:    OVER THE COUNTER MEDICATION, Optimal M (Patient not taking: Reported on 02/12/2024), Disp: , Rfl:    OVER THE COUNTER MEDICATION, Tahitian Noni Juice (Patient not taking: Reported on 02/12/2024), Disp: , Rfl:    OVER THE COUNTER MEDICATION, Vinali (Patient not taking: Reported on 02/12/2024), Disp: , Rfl:    OVER THE COUNTER MEDICATION, Maginacal D (Patient not taking: Reported on 02/12/2024), Disp: , Rfl:    OVER THE COUNTER MEDICATION, Xceler8 (Patient not taking: Reported on 02/12/2024), Disp: , Rfl:    OVER THE COUNTER MEDICATION, Xceler8 Slenderiix (Patient not taking: Reported on 02/12/2024), Disp: , Rfl:  [2] No Known Allergies

## 2024-02-13 ENCOUNTER — Other Ambulatory Visit

## 2024-02-14 LAB — HEPATITIS B SURFACE ANTIBODY,QUALITATIVE: Hep B Surface Ab, Qual: NONREACTIVE

## 2024-02-14 LAB — LIPID PANEL
Chol/HDL Ratio: 2.3 ratio (ref 0.0–4.4)
Cholesterol, Total: 190 mg/dL (ref 100–199)
HDL: 82 mg/dL (ref >39)
LDL Chol Calc (NIH): 97 mg/dL (ref 0–99)
Triglycerides: 56 mg/dL (ref 0–149)
VLDL Cholesterol Cal: 11 mg/dL (ref 5–40)

## 2024-02-14 LAB — HEMOGLOBIN A1C
Est. average glucose Bld gHb Est-mCnc: 140 mg/dL
Hgb A1c MFr Bld: 6.5 % — ABNORMAL HIGH (ref 4.8–5.6)

## 2024-02-15 LAB — OPHTHALMOLOGY REPORT-SCANNED

## 2024-02-18 ENCOUNTER — Ambulatory Visit: Payer: Self-pay | Admitting: Nurse Practitioner

## 2024-02-18 DIAGNOSIS — H6122 Impacted cerumen, left ear: Secondary | ICD-10-CM | POA: Insufficient documentation

## 2024-02-18 NOTE — Assessment & Plan Note (Signed)
 Reports muffled hearing. Examination shows pale tympanic membrane, no significant cerumen impaction. Possible eustachian tube dysfunction. - Performed hearing test to assess hearing loss - normal - Will consider referral to ENT if hearing test indicates abnormality.

## 2024-02-18 NOTE — Assessment & Plan Note (Signed)
 Blood sugar levels not monitored. A1c was 6.5 in September. Not taking prescribed medications, including simvastatin . - Ordered A1c test to assess current glycemic control. - Encouraged adherence to prescribed medications, including simvastatin .

## 2024-02-18 NOTE — Assessment & Plan Note (Signed)
 Cholesterol levels elevated with LDL at 114, above target due to diabetes. Increased cardiovascular risk. - Encouraged adherence to simvastatin  for lipid management.

## 2024-05-06 ENCOUNTER — Encounter: Payer: Self-pay | Admitting: Nurse Practitioner
